# Patient Record
Sex: Female | Born: 1960
Health system: Southern US, Community
[De-identification: ages and names within clinical notes are randomized; demographics above are authoritative.]

## PROBLEM LIST (undated history)

## (undated) DIAGNOSIS — C50919 Malignant neoplasm of unspecified site of unspecified female breast: Secondary | ICD-10-CM

## (undated) DIAGNOSIS — C801 Malignant (primary) neoplasm, unspecified: Secondary | ICD-10-CM

## (undated) DIAGNOSIS — Z853 Personal history of malignant neoplasm of breast: Secondary | ICD-10-CM

## (undated) DIAGNOSIS — R921 Mammographic calcification found on diagnostic imaging of breast: Secondary | ICD-10-CM

## (undated) HISTORY — PX: BREAST LUMPECTOMY: SHX2

## (undated) HISTORY — PX: BREAST RECONSTRUCTION: SHX9

## (undated) HISTORY — DX: Malignant neoplasm of unspecified site of unspecified female breast: C50.919

## (undated) HISTORY — DX: Mammographic calcification found on diagnostic imaging of breast: R92.1

---

## 2008-06-16 HISTORY — PX: MASTECTOMY, RADICAL: SHX710

## 2008-07-10 ENCOUNTER — Ambulatory Visit: Payer: Self-pay | Admitting: Family Medicine

## 2008-07-10 DIAGNOSIS — F329 Major depressive disorder, single episode, unspecified: Secondary | ICD-10-CM

## 2008-07-10 DIAGNOSIS — N63 Unspecified lump in unspecified breast: Secondary | ICD-10-CM

## 2008-07-10 DIAGNOSIS — Z8659 Personal history of other mental and behavioral disorders: Secondary | ICD-10-CM | POA: Insufficient documentation

## 2008-07-11 ENCOUNTER — Telehealth: Payer: Self-pay | Admitting: Family Medicine

## 2008-07-14 ENCOUNTER — Encounter (INDEPENDENT_AMBULATORY_CARE_PROVIDER_SITE_OTHER): Payer: Self-pay | Admitting: Diagnostic Radiology

## 2008-07-14 ENCOUNTER — Encounter: Admission: RE | Admit: 2008-07-14 | Discharge: 2008-07-14 | Payer: Self-pay | Admitting: Family Medicine

## 2008-07-14 ENCOUNTER — Encounter: Payer: Self-pay | Admitting: Family Medicine

## 2008-07-17 LAB — CONVERTED CEMR LAB: Glucose, Bld: 98 mg/dL (ref 70–99)

## 2008-07-18 ENCOUNTER — Encounter: Admission: RE | Admit: 2008-07-18 | Discharge: 2008-07-18 | Payer: Self-pay | Admitting: Family Medicine

## 2008-07-19 ENCOUNTER — Encounter: Admission: RE | Admit: 2008-07-19 | Discharge: 2008-07-19 | Payer: Self-pay | Admitting: Family Medicine

## 2008-08-01 ENCOUNTER — Encounter: Admission: RE | Admit: 2008-08-01 | Discharge: 2008-08-01 | Payer: Self-pay | Admitting: General Surgery

## 2008-08-02 ENCOUNTER — Encounter: Payer: Self-pay | Admitting: Family Medicine

## 2008-08-02 ENCOUNTER — Ambulatory Visit (HOSPITAL_BASED_OUTPATIENT_CLINIC_OR_DEPARTMENT_OTHER): Admission: RE | Admit: 2008-08-02 | Discharge: 2008-08-02 | Payer: Self-pay | Admitting: General Surgery

## 2008-08-02 ENCOUNTER — Encounter (INDEPENDENT_AMBULATORY_CARE_PROVIDER_SITE_OTHER): Payer: Self-pay | Admitting: General Surgery

## 2008-08-23 ENCOUNTER — Ambulatory Visit: Payer: Self-pay | Admitting: Oncology

## 2008-08-24 ENCOUNTER — Ambulatory Visit: Admission: RE | Admit: 2008-08-24 | Discharge: 2008-09-25 | Payer: Self-pay | Admitting: General Surgery

## 2008-08-25 ENCOUNTER — Encounter: Payer: Self-pay | Admitting: Family Medicine

## 2008-08-30 ENCOUNTER — Encounter (INDEPENDENT_AMBULATORY_CARE_PROVIDER_SITE_OTHER): Payer: Self-pay | Admitting: General Surgery

## 2008-08-30 ENCOUNTER — Ambulatory Visit (HOSPITAL_COMMUNITY): Admission: RE | Admit: 2008-08-30 | Discharge: 2008-08-31 | Payer: Self-pay | Admitting: General Surgery

## 2008-09-06 ENCOUNTER — Encounter: Payer: Self-pay | Admitting: Family Medicine

## 2008-09-13 ENCOUNTER — Encounter: Payer: Self-pay | Admitting: Family Medicine

## 2008-09-13 LAB — CBC WITH DIFFERENTIAL/PLATELET
Basophils Absolute: 0.1 10*3/uL (ref 0.0–0.1)
HCT: 39.3 % (ref 34.8–46.6)
HGB: 13.3 g/dL (ref 11.6–15.9)
LYMPH%: 16.8 % (ref 14.0–49.7)
MCH: 32.1 pg (ref 25.1–34.0)
MONO#: 0.4 10*3/uL (ref 0.1–0.9)
NEUT%: 75.5 % (ref 38.4–76.8)
Platelets: 398 10*3/uL (ref 145–400)
WBC: 8.1 10*3/uL (ref 3.9–10.3)
lymph#: 1.4 10*3/uL (ref 0.9–3.3)

## 2008-09-14 LAB — COMPREHENSIVE METABOLIC PANEL
BUN: 10 mg/dL (ref 6–23)
CO2: 25 mEq/L (ref 19–32)
Calcium: 8.8 mg/dL (ref 8.4–10.5)
Chloride: 102 mEq/L (ref 96–112)
Creatinine, Ser: 0.71 mg/dL (ref 0.40–1.20)
Glucose, Bld: 95 mg/dL (ref 70–99)
Total Bilirubin: 0.4 mg/dL (ref 0.3–1.2)

## 2008-09-14 LAB — LACTATE DEHYDROGENASE: LDH: 139 U/L (ref 94–250)

## 2008-09-14 LAB — CANCER ANTIGEN 27.29: CA 27.29: <4 U/mL (ref 0–39)

## 2008-09-15 ENCOUNTER — Ambulatory Visit (HOSPITAL_COMMUNITY): Admission: RE | Admit: 2008-09-15 | Discharge: 2008-09-15 | Payer: Self-pay | Admitting: Oncology

## 2008-09-20 ENCOUNTER — Encounter: Payer: Self-pay | Admitting: Oncology

## 2008-09-20 ENCOUNTER — Ambulatory Visit (HOSPITAL_COMMUNITY): Admission: RE | Admit: 2008-09-20 | Discharge: 2008-09-20 | Payer: Self-pay | Admitting: Oncology

## 2008-09-20 ENCOUNTER — Ambulatory Visit: Payer: Self-pay | Admitting: Internal Medicine

## 2008-09-20 LAB — PROTHROMBIN TIME
INR: 1 (ref 0.0–1.5)
Prothrombin Time: 13.3 seconds (ref 11.6–15.2)

## 2008-09-20 LAB — PROTEIN / CREATININE RATIO, URINE: Protein Creatinine Ratio: 0.1 (ref <0.15)

## 2008-10-12 ENCOUNTER — Ambulatory Visit: Payer: Self-pay | Admitting: Oncology

## 2008-10-25 ENCOUNTER — Encounter: Payer: Self-pay | Admitting: Family Medicine

## 2008-10-25 LAB — CBC WITH DIFFERENTIAL/PLATELET
Basophils Absolute: 0.1 10*3/uL (ref 0.0–0.1)
EOS%: 0 % (ref 0.0–7.0)
Eosinophils Absolute: 0 10*3/uL (ref 0.0–0.5)
HGB: 12.7 g/dL (ref 11.6–15.9)
NEUT#: 5 10*3/uL (ref 1.5–6.5)
RDW: 12.2 % (ref 11.2–14.5)
lymph#: 1.4 10*3/uL (ref 0.9–3.3)

## 2008-10-25 LAB — COMPREHENSIVE METABOLIC PANEL
ALT: 16 U/L (ref 0–35)
CO2: 28 mEq/L (ref 19–32)
Calcium: 9 mg/dL (ref 8.4–10.5)
Chloride: 101 mEq/L (ref 96–112)
Creatinine, Ser: 0.71 mg/dL (ref 0.40–1.20)
Total Protein: 6.8 g/dL (ref 6.0–8.3)

## 2008-10-25 LAB — RESEARCH LABS

## 2008-11-01 ENCOUNTER — Encounter: Payer: Self-pay | Admitting: Family Medicine

## 2008-11-01 LAB — CBC WITH DIFFERENTIAL/PLATELET
BASO%: 0.5 % (ref 0.0–2.0)
MCHC: 33.9 g/dL (ref 31.5–36.0)
MONO#: 0 10*3/uL — ABNORMAL LOW (ref 0.1–0.9)
RBC: 4.26 10*6/uL (ref 3.70–5.45)
RDW: 12.4 % (ref 11.2–14.5)
WBC: 6 10*3/uL (ref 3.9–10.3)
lymph#: 1 10*3/uL (ref 0.9–3.3)

## 2008-11-08 ENCOUNTER — Encounter: Payer: Self-pay | Admitting: Family Medicine

## 2008-11-08 LAB — CBC WITH DIFFERENTIAL/PLATELET
BASO%: 0.7 % (ref 0.0–2.0)
Basophils Absolute: 0.1 10e3/uL (ref 0.0–0.1)
EOS%: 0.1 % (ref 0.0–7.0)
Eosinophils Absolute: 0 10e3/uL (ref 0.0–0.5)
HCT: 39.6 % (ref 34.8–46.6)
HGB: 13.5 g/dL (ref 11.6–15.9)
LYMPH%: 17.9 % (ref 14.0–49.7)
MCH: 31.9 pg (ref 25.1–34.0)
MCHC: 34.2 g/dL (ref 31.5–36.0)
MCV: 93.3 fL (ref 79.5–101.0)
MONO#: 0.5 10e3/uL (ref 0.1–0.9)
MONO%: 5.1 % (ref 0.0–14.0)
NEUT#: 7.8 10e3/uL — ABNORMAL HIGH (ref 1.5–6.5)
NEUT%: 76.2 % (ref 38.4–76.8)
Platelets: 282 10e3/uL (ref 145–400)
RBC: 4.25 10e6/uL (ref 3.70–5.45)
RDW: 12.3 % (ref 11.2–14.5)
WBC: 10.2 10e3/uL (ref 3.9–10.3)
lymph#: 1.8 10e3/uL (ref 0.9–3.3)

## 2008-11-08 LAB — COMPREHENSIVE METABOLIC PANEL
ALT: 22 U/L (ref 0–35)
Albumin: 4 g/dL (ref 3.5–5.2)
BUN: 7 mg/dL (ref 6–23)
CO2: 28 mEq/L (ref 19–32)
Calcium: 9.2 mg/dL (ref 8.4–10.5)
Chloride: 102 mEq/L (ref 96–112)
Creatinine, Ser: 0.68 mg/dL (ref 0.40–1.20)
Potassium: 4.7 mEq/L (ref 3.5–5.3)

## 2008-11-15 ENCOUNTER — Encounter: Payer: Self-pay | Admitting: Family Medicine

## 2008-11-15 LAB — CBC WITH DIFFERENTIAL/PLATELET
Eosinophils Absolute: 0 10*3/uL (ref 0.0–0.5)
HCT: 36.9 % (ref 34.8–46.6)
LYMPH%: 9.5 % — ABNORMAL LOW (ref 14.0–49.7)
MONO#: 0.1 10*3/uL (ref 0.1–0.9)
NEUT#: 5.9 10*3/uL (ref 1.5–6.5)
Platelets: 206 10*3/uL (ref 145–400)
RBC: 4.03 10*6/uL (ref 3.70–5.45)
WBC: 6.7 10*3/uL (ref 3.9–10.3)
nRBC: 0 % (ref 0–0)

## 2008-11-23 ENCOUNTER — Encounter: Payer: Self-pay | Admitting: Family Medicine

## 2008-11-23 LAB — COMPREHENSIVE METABOLIC PANEL
AST: 27 U/L (ref 0–37)
Albumin: 3.9 g/dL (ref 3.5–5.2)
BUN: 6 mg/dL (ref 6–23)
Calcium: 8.8 mg/dL (ref 8.4–10.5)
Chloride: 103 mEq/L (ref 96–112)
Glucose, Bld: 73 mg/dL (ref 70–99)
Potassium: 3.9 mEq/L (ref 3.5–5.3)
Sodium: 139 mEq/L (ref 135–145)
Total Protein: 6.7 g/dL (ref 6.0–8.3)

## 2008-11-23 LAB — CBC WITH DIFFERENTIAL/PLATELET
Basophils Absolute: 0 10*3/uL (ref 0.0–0.1)
Eosinophils Absolute: 0 10*3/uL (ref 0.0–0.5)
HGB: 12.8 g/dL (ref 11.6–15.9)
NEUT#: 6.8 10*3/uL — ABNORMAL HIGH (ref 1.5–6.5)
RBC: 3.93 10*6/uL (ref 3.70–5.45)
RDW: 12.3 % (ref 11.2–14.5)
WBC: 8.5 10*3/uL (ref 3.9–10.3)
lymph#: 1.3 10*3/uL (ref 0.9–3.3)

## 2008-12-05 ENCOUNTER — Ambulatory Visit: Payer: Self-pay | Admitting: Oncology

## 2008-12-07 ENCOUNTER — Encounter: Payer: Self-pay | Admitting: Family Medicine

## 2008-12-07 LAB — COMPREHENSIVE METABOLIC PANEL
Albumin: 4.1 g/dL (ref 3.5–5.2)
BUN: 6 mg/dL (ref 6–23)
Calcium: 9 mg/dL (ref 8.4–10.5)
Chloride: 102 mEq/L (ref 96–112)
Glucose, Bld: 92 mg/dL (ref 70–99)
Potassium: 4.6 mEq/L (ref 3.5–5.3)

## 2008-12-07 LAB — CBC WITH DIFFERENTIAL/PLATELET
Basophils Absolute: 0 10*3/uL (ref 0.0–0.1)
Eosinophils Absolute: 0 10*3/uL (ref 0.0–0.5)
HCT: 35.1 % (ref 34.8–46.6)
MCV: 94.2 fL (ref 79.5–101.0)
Platelets: 291 10*3/uL (ref 145–400)
RDW: 12.7 % (ref 11.2–14.5)
WBC: 9.3 10*3/uL (ref 3.9–10.3)
lymph#: 1.1 10*3/uL (ref 0.9–3.3)

## 2008-12-14 ENCOUNTER — Encounter: Payer: Self-pay | Admitting: Family Medicine

## 2008-12-14 ENCOUNTER — Encounter: Payer: Self-pay | Admitting: Oncology

## 2008-12-14 ENCOUNTER — Encounter: Payer: Self-pay | Admitting: Internal Medicine

## 2008-12-14 ENCOUNTER — Ambulatory Visit: Payer: Self-pay

## 2008-12-14 LAB — CBC WITH DIFFERENTIAL/PLATELET
BASO%: 1.9 % (ref 0.0–2.0)
EOS%: 0 % (ref 0.0–7.0)
LYMPH%: 8.8 % — ABNORMAL LOW (ref 14.0–49.7)
MCH: 30.9 pg (ref 25.1–34.0)
MCHC: 33.6 g/dL (ref 31.5–36.0)
MONO#: 0.1 10*3/uL (ref 0.1–0.9)
RBC: 3.62 10*6/uL — ABNORMAL LOW (ref 3.70–5.45)
WBC: 5.7 10*3/uL (ref 3.9–10.3)
lymph#: 0.5 10*3/uL — ABNORMAL LOW (ref 0.9–3.3)

## 2008-12-20 ENCOUNTER — Encounter: Payer: Self-pay | Admitting: Family Medicine

## 2008-12-20 LAB — COMPREHENSIVE METABOLIC PANEL
ALT: 14 U/L (ref 0–35)
AST: 17 U/L (ref 0–37)
Albumin: 4.3 g/dL (ref 3.5–5.2)
CO2: 26 mEq/L (ref 19–32)
Calcium: 9.1 mg/dL (ref 8.4–10.5)
Chloride: 104 mEq/L (ref 96–112)
Creatinine, Ser: 0.73 mg/dL (ref 0.40–1.20)
Potassium: 4.4 mEq/L (ref 3.5–5.3)
Sodium: 140 mEq/L (ref 135–145)
Total Protein: 6.8 g/dL (ref 6.0–8.3)

## 2008-12-20 LAB — CBC WITH DIFFERENTIAL/PLATELET
BASO%: 2 % (ref 0.0–2.0)
EOS%: 0 % (ref 0.0–7.0)
HCT: 33.5 % — ABNORMAL LOW (ref 34.8–46.6)
HGB: 11.5 g/dL — ABNORMAL LOW (ref 11.6–15.9)
MCHC: 34.4 g/dL (ref 31.5–36.0)
MONO#: 0.7 10*3/uL (ref 0.1–0.9)
NEUT%: 66.9 % (ref 38.4–76.8)
RDW: 14.2 % (ref 11.2–14.5)
WBC: 4.7 10*3/uL (ref 3.9–10.3)
lymph#: 0.8 10*3/uL — ABNORMAL LOW (ref 0.9–3.3)

## 2008-12-20 LAB — APTT: aPTT: 23 seconds — ABNORMAL LOW (ref 24–37)

## 2008-12-28 ENCOUNTER — Encounter: Payer: Self-pay | Admitting: Family Medicine

## 2008-12-28 LAB — CBC WITH DIFFERENTIAL/PLATELET
Basophils Absolute: 0.1 10*3/uL (ref 0.0–0.1)
EOS%: 0 % (ref 0.0–7.0)
HCT: 33.1 % — ABNORMAL LOW (ref 34.8–46.6)
HGB: 10.8 g/dL — ABNORMAL LOW (ref 11.6–15.9)
MCH: 30.8 pg (ref 25.1–34.0)
MONO#: 0.4 10*3/uL (ref 0.1–0.9)
NEUT#: 2.4 10*3/uL (ref 1.5–6.5)
RDW: 16 % — ABNORMAL HIGH (ref 11.2–14.5)
WBC: 3.6 10*3/uL — ABNORMAL LOW (ref 3.9–10.3)
lymph#: 0.8 10*3/uL — ABNORMAL LOW (ref 0.9–3.3)

## 2009-01-04 ENCOUNTER — Ambulatory Visit: Payer: Self-pay | Admitting: Oncology

## 2009-01-04 ENCOUNTER — Encounter: Payer: Self-pay | Admitting: Family Medicine

## 2009-01-04 LAB — CBC WITH DIFFERENTIAL/PLATELET
Basophils Absolute: 0.1 10*3/uL (ref 0.0–0.1)
Eosinophils Absolute: 0 10*3/uL (ref 0.0–0.5)
HGB: 10.9 g/dL — ABNORMAL LOW (ref 11.6–15.9)
LYMPH%: 20.6 % (ref 14.0–49.7)
MONO#: 0.3 10*3/uL (ref 0.1–0.9)
NEUT#: 2.4 10*3/uL (ref 1.5–6.5)
Platelets: 290 10*3/uL (ref 145–400)
RBC: 3.28 10*6/uL — ABNORMAL LOW (ref 3.70–5.45)
RDW: 17.8 % — ABNORMAL HIGH (ref 11.2–14.5)
WBC: 3.6 10*3/uL — ABNORMAL LOW (ref 3.9–10.3)

## 2009-01-11 ENCOUNTER — Encounter: Payer: Self-pay | Admitting: Family Medicine

## 2009-01-11 LAB — CBC WITH DIFFERENTIAL/PLATELET
Eosinophils Absolute: 0 10*3/uL (ref 0.0–0.5)
HCT: 33.6 % — ABNORMAL LOW (ref 34.8–46.6)
LYMPH%: 19.3 % (ref 14.0–49.7)
MONO#: 0.4 10*3/uL (ref 0.1–0.9)
NEUT#: 2.6 10*3/uL (ref 1.5–6.5)
NEUT%: 68.4 % (ref 38.4–76.8)
Platelets: 324 10*3/uL (ref 145–400)
WBC: 3.8 10*3/uL — ABNORMAL LOW (ref 3.9–10.3)

## 2009-01-11 LAB — COMPREHENSIVE METABOLIC PANEL
BUN: 9 mg/dL (ref 6–23)
CO2: 28 mEq/L (ref 19–32)
Creatinine, Ser: 0.58 mg/dL (ref 0.40–1.20)
Glucose, Bld: 99 mg/dL (ref 70–99)
Total Bilirubin: 0.7 mg/dL (ref 0.3–1.2)

## 2009-01-17 ENCOUNTER — Encounter: Payer: Self-pay | Admitting: Family Medicine

## 2009-01-17 LAB — CBC WITH DIFFERENTIAL/PLATELET
Basophils Absolute: 0.1 10*3/uL (ref 0.0–0.1)
Eosinophils Absolute: 0 10*3/uL (ref 0.0–0.5)
HCT: 33.8 % — ABNORMAL LOW (ref 34.8–46.6)
HGB: 11.4 g/dL — ABNORMAL LOW (ref 11.6–15.9)
MCH: 33.8 pg (ref 25.1–34.0)
MCV: 99.7 fL (ref 79.5–101.0)
MONO%: 4.6 % (ref 0.0–14.0)
NEUT#: 4.7 10*3/uL (ref 1.5–6.5)
NEUT%: 78.1 % — ABNORMAL HIGH (ref 38.4–76.8)
RDW: 18.5 % — ABNORMAL HIGH (ref 11.2–14.5)
lymph#: 1 10*3/uL (ref 0.9–3.3)

## 2009-01-25 ENCOUNTER — Encounter: Payer: Self-pay | Admitting: Family Medicine

## 2009-01-25 LAB — CBC WITH DIFFERENTIAL/PLATELET
Eosinophils Absolute: 0 10*3/uL (ref 0.0–0.5)
HCT: 34.6 % — ABNORMAL LOW (ref 34.8–46.6)
LYMPH%: 23.2 % (ref 14.0–49.7)
MONO#: 0.4 10*3/uL (ref 0.1–0.9)
NEUT#: 2.1 10*3/uL (ref 1.5–6.5)
Platelets: 289 10*3/uL (ref 145–400)
RBC: 3.43 10*6/uL — ABNORMAL LOW (ref 3.70–5.45)
WBC: 3.4 10*3/uL — ABNORMAL LOW (ref 3.9–10.3)
lymph#: 0.8 10*3/uL — ABNORMAL LOW (ref 0.9–3.3)

## 2009-01-31 ENCOUNTER — Encounter: Payer: Self-pay | Admitting: Family Medicine

## 2009-01-31 LAB — CBC WITH DIFFERENTIAL/PLATELET
BASO%: 1.6 % (ref 0.0–2.0)
Basophils Absolute: 0.1 10*3/uL (ref 0.0–0.1)
Eosinophils Absolute: 0 10*3/uL (ref 0.0–0.5)
HCT: 36.6 % (ref 34.8–46.6)
HGB: 12.3 g/dL (ref 11.6–15.9)
LYMPH%: 17.6 % (ref 14.0–49.7)
MCHC: 33.7 g/dL (ref 31.5–36.0)
MONO#: 0.2 10*3/uL (ref 0.1–0.9)
NEUT%: 75.5 % (ref 38.4–76.8)
Platelets: 320 10*3/uL (ref 145–400)
WBC: 3.7 10*3/uL — ABNORMAL LOW (ref 3.9–10.3)
lymph#: 0.7 10*3/uL — ABNORMAL LOW (ref 0.9–3.3)

## 2009-01-31 LAB — PROTEIN / CREATININE RATIO, URINE
Creatinine, Urine: 94.9 mg/dL
Protein Creatinine Ratio: 0.07 (ref <0.15)
Total Protein, Urine: 7 mg/dL

## 2009-01-31 LAB — COMPREHENSIVE METABOLIC PANEL
BUN: 8 mg/dL (ref 6–23)
CO2: 29 mEq/L (ref 19–32)
Calcium: 9.2 mg/dL (ref 8.4–10.5)
Chloride: 105 mEq/L (ref 96–112)
Creatinine, Ser: 0.62 mg/dL (ref 0.40–1.20)
Glucose, Bld: 141 mg/dL — ABNORMAL HIGH (ref 70–99)
Total Bilirubin: 0.6 mg/dL (ref 0.3–1.2)

## 2009-02-06 ENCOUNTER — Ambulatory Visit: Payer: Self-pay | Admitting: Oncology

## 2009-02-08 ENCOUNTER — Encounter: Payer: Self-pay | Admitting: Family Medicine

## 2009-02-08 LAB — CBC WITH DIFFERENTIAL/PLATELET
BASO%: 1.7 % (ref 0.0–2.0)
Eosinophils Absolute: 0 10*3/uL (ref 0.0–0.5)
LYMPH%: 17.8 % (ref 14.0–49.7)
MCHC: 34.4 g/dL (ref 31.5–36.0)
MONO#: 0.3 10*3/uL (ref 0.1–0.9)
NEUT#: 2.8 10*3/uL (ref 1.5–6.5)
Platelets: 309 10*3/uL (ref 145–400)
RBC: 3.35 10*6/uL — ABNORMAL LOW (ref 3.70–5.45)
RDW: 15.4 % — ABNORMAL HIGH (ref 11.2–14.5)
WBC: 4 10*3/uL (ref 3.9–10.3)

## 2009-02-15 ENCOUNTER — Encounter: Payer: Self-pay | Admitting: Family Medicine

## 2009-02-15 LAB — CBC WITH DIFFERENTIAL/PLATELET
BASO%: 1.7 % (ref 0.0–2.0)
EOS%: 0.4 % (ref 0.0–7.0)
MCH: 34.5 pg — ABNORMAL HIGH (ref 25.1–34.0)
MCHC: 33.9 g/dL (ref 31.5–36.0)
MCV: 101.8 fL — ABNORMAL HIGH (ref 79.5–101.0)
MONO%: 12.6 % (ref 0.0–14.0)
NEUT#: 2.6 10*3/uL (ref 1.5–6.5)
RBC: 3.51 10*6/uL — ABNORMAL LOW (ref 3.70–5.45)
RDW: 14.8 % — ABNORMAL HIGH (ref 11.2–14.5)

## 2009-02-22 ENCOUNTER — Encounter: Payer: Self-pay | Admitting: Family Medicine

## 2009-02-22 LAB — CBC WITH DIFFERENTIAL/PLATELET
BASO%: 2.1 % — ABNORMAL HIGH (ref 0.0–2.0)
Eosinophils Absolute: 0 10*3/uL (ref 0.0–0.5)
MCHC: 34.7 g/dL (ref 31.5–36.0)
MONO#: 0.5 10*3/uL (ref 0.1–0.9)
NEUT#: 3.4 10*3/uL (ref 1.5–6.5)
RBC: 3.41 10*6/uL — ABNORMAL LOW (ref 3.70–5.45)
RDW: 14.2 % (ref 11.2–14.5)
WBC: 5 10*3/uL (ref 3.9–10.3)

## 2009-02-22 LAB — COMPREHENSIVE METABOLIC PANEL
ALT: 23 U/L (ref 0–35)
Albumin: 4 g/dL (ref 3.5–5.2)
Alkaline Phosphatase: 44 U/L (ref 39–117)
CO2: 29 mEq/L (ref 19–32)
Glucose, Bld: 99 mg/dL (ref 70–99)
Potassium: 3.9 mEq/L (ref 3.5–5.3)
Sodium: 135 mEq/L (ref 135–145)
Total Protein: 7.1 g/dL (ref 6.0–8.3)

## 2009-03-01 ENCOUNTER — Encounter: Payer: Self-pay | Admitting: Family Medicine

## 2009-03-01 LAB — CBC WITH DIFFERENTIAL/PLATELET
BASO%: 2.3 % — ABNORMAL HIGH (ref 0.0–2.0)
EOS%: 0.7 % (ref 0.0–7.0)
Eosinophils Absolute: 0 10*3/uL (ref 0.0–0.5)
LYMPH%: 22.1 % (ref 14.0–49.7)
MCHC: 33.6 g/dL (ref 31.5–36.0)
MCV: 101.6 fL — ABNORMAL HIGH (ref 79.5–101.0)
MONO%: 11.8 % (ref 0.0–14.0)
NEUT#: 2.7 10*3/uL (ref 1.5–6.5)
Platelets: 293 10*3/uL (ref 145–400)
RBC: 3.72 10*6/uL (ref 3.70–5.45)
RDW: 13.2 % (ref 11.2–14.5)
WBC: 4.3 10*3/uL (ref 3.9–10.3)

## 2009-03-08 ENCOUNTER — Encounter: Payer: Self-pay | Admitting: Family Medicine

## 2009-03-08 ENCOUNTER — Ambulatory Visit: Payer: Self-pay | Admitting: Oncology

## 2009-03-08 LAB — CBC WITH DIFFERENTIAL/PLATELET
BASO%: 0.4 % (ref 0.0–2.0)
Eosinophils Absolute: 0 10*3/uL (ref 0.0–0.5)
LYMPH%: 9.2 % — ABNORMAL LOW (ref 14.0–49.7)
MCHC: 34.3 g/dL (ref 31.5–36.0)
MONO#: 0.5 10*3/uL (ref 0.1–0.9)
NEUT#: 5.3 10*3/uL (ref 1.5–6.5)
Platelets: 284 10*3/uL (ref 145–400)
RBC: 3.64 10*6/uL — ABNORMAL LOW (ref 3.70–5.45)
RDW: 13.5 % (ref 11.2–14.5)
WBC: 6.4 10*3/uL (ref 3.9–10.3)
lymph#: 0.6 10*3/uL — ABNORMAL LOW (ref 0.9–3.3)

## 2009-03-13 ENCOUNTER — Ambulatory Visit: Payer: Self-pay | Admitting: Family Medicine

## 2009-03-13 ENCOUNTER — Encounter: Payer: Self-pay | Admitting: Family Medicine

## 2009-03-13 ENCOUNTER — Other Ambulatory Visit: Admission: RE | Admit: 2009-03-13 | Discharge: 2009-03-13 | Payer: Self-pay | Admitting: Family Medicine

## 2009-03-14 ENCOUNTER — Encounter: Payer: Self-pay | Admitting: Oncology

## 2009-03-14 ENCOUNTER — Ambulatory Visit: Payer: Self-pay

## 2009-03-14 ENCOUNTER — Encounter: Payer: Self-pay | Admitting: Cardiology

## 2009-03-15 ENCOUNTER — Encounter: Payer: Self-pay | Admitting: Family Medicine

## 2009-03-15 LAB — CBC WITH DIFFERENTIAL/PLATELET
Basophils Absolute: 0.1 10*3/uL (ref 0.0–0.1)
EOS%: 0.4 % (ref 0.0–7.0)
HCT: 36.7 % (ref 34.8–46.6)
HGB: 12.6 g/dL (ref 11.6–15.9)
MCH: 34.3 pg — ABNORMAL HIGH (ref 25.1–34.0)
MONO#: 0.5 10*3/uL (ref 0.1–0.9)
NEUT#: 3 10*3/uL (ref 1.5–6.5)
NEUT%: 67.2 % (ref 38.4–76.8)
RDW: 13.2 % (ref 11.2–14.5)
WBC: 4.5 10*3/uL (ref 3.9–10.3)
lymph#: 0.9 10*3/uL (ref 0.9–3.3)

## 2009-03-15 LAB — UA PROTEIN, DIPSTICK - CHCC: Protein, Urine: NEGATIVE mg/dL

## 2009-03-19 LAB — CONVERTED CEMR LAB: Pap Smear: NORMAL

## 2009-04-28 IMAGING — CT CT CHEST W/ CM
2 of 3 series · 15 of 36 positions shown, 18 images · IV contrast (agent unspecified)
Comparison: None

CLINICAL DATA: New diagnosis of breast cancer.

CT CHEST WITH CONTRAST
TECHNIQUE: Multidetector CT imaging of the chest was performed
following the standard protocol during bolus administration of
intravenous contrast.
Contrast: 80 ml Emnipaque-FGG

[Series 2: chest with st · axial · 0.66mm/px · z∈[-307,-37]mm · 12 of 64 slices shown, 15 images]
[im 5/64  mediastinal]
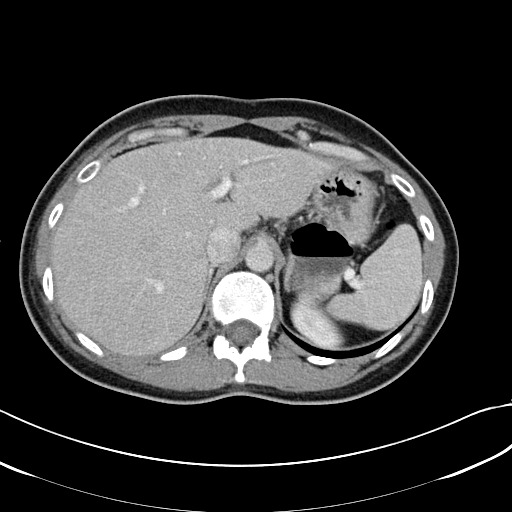
[im 5/64  lung]
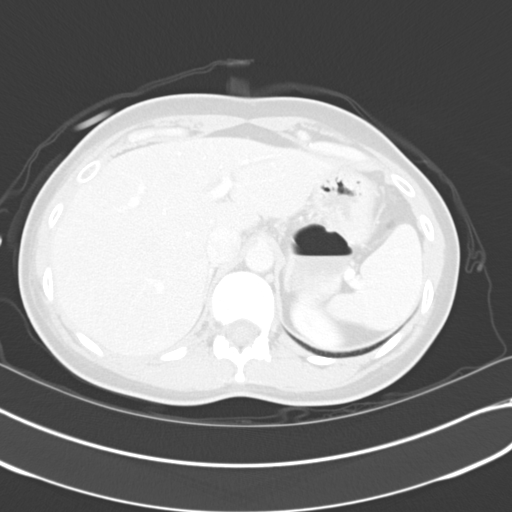
[im 10/64  lung]
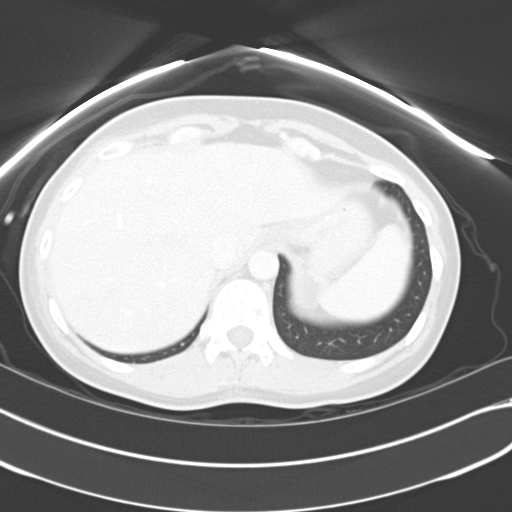
[im 15/64  lung]
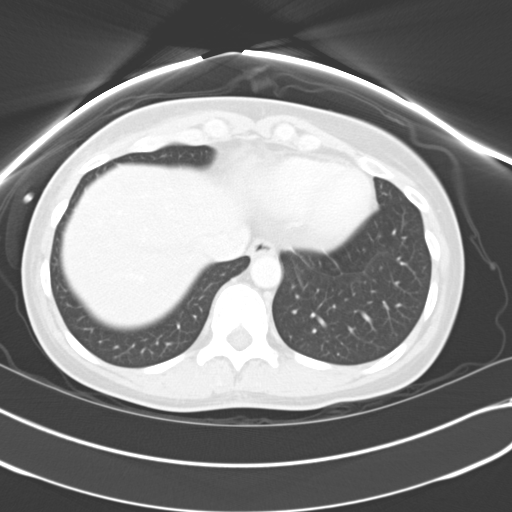
[im 19/64  lung]
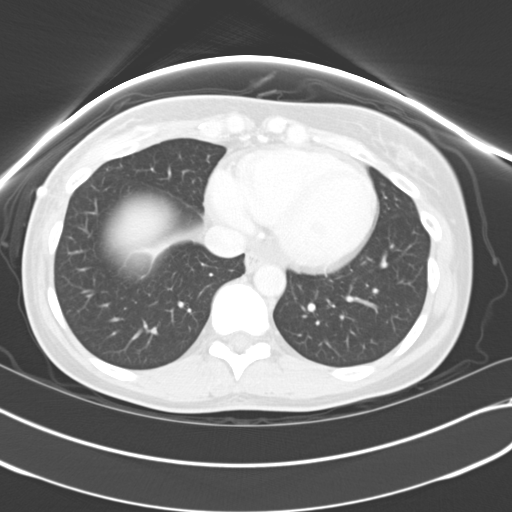
[im 24/64  mediastinal]
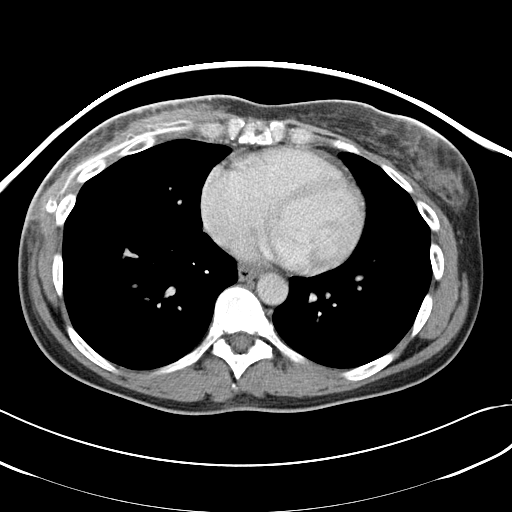
[im 24/64  lung]
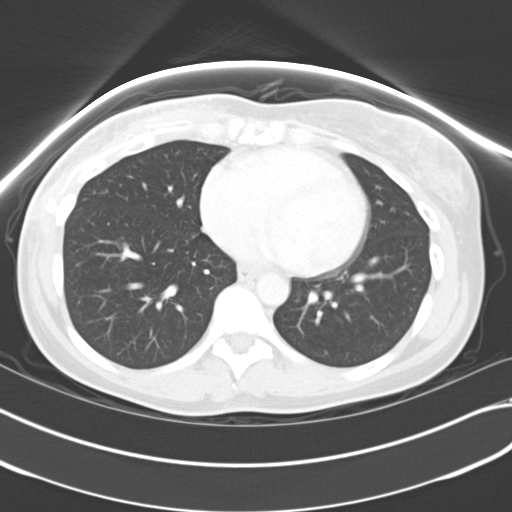
[im 29/64  lung]
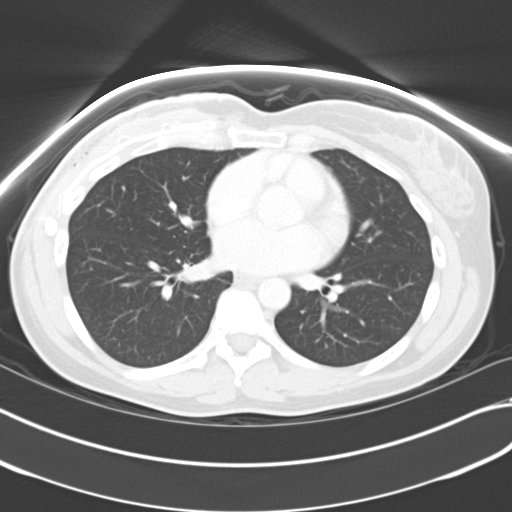
[im 36/64  lung]
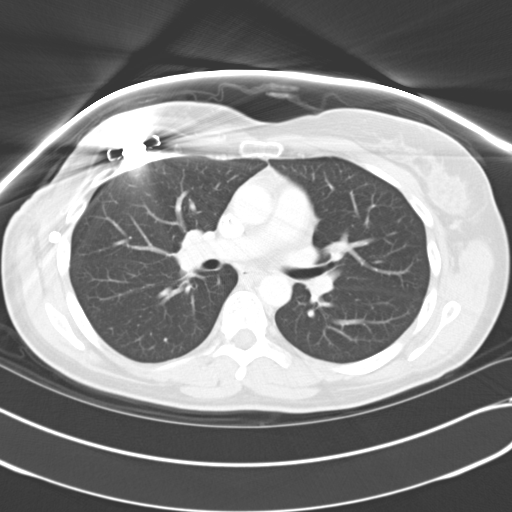
[im 40/64  lung]
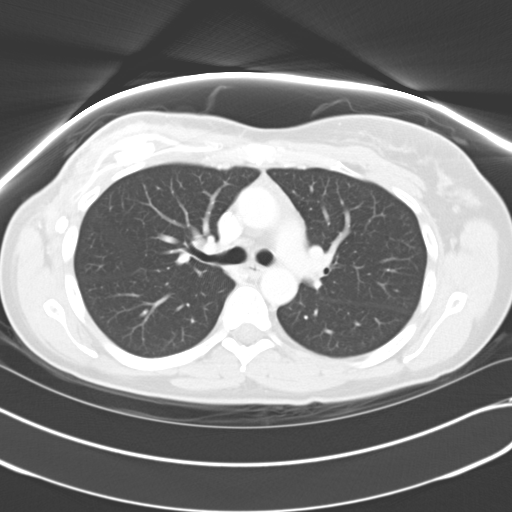
[im 45/64  mediastinal]
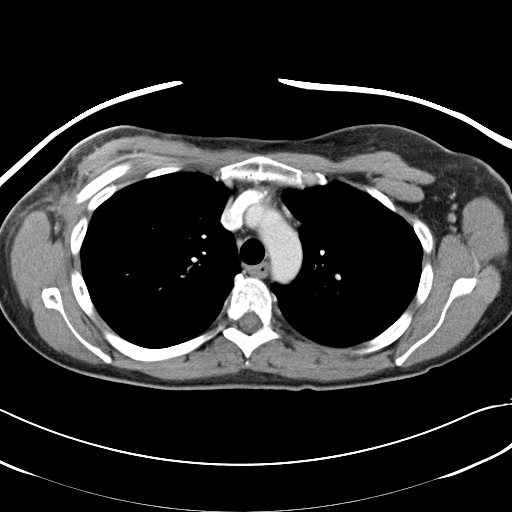
[im 45/64  lung]
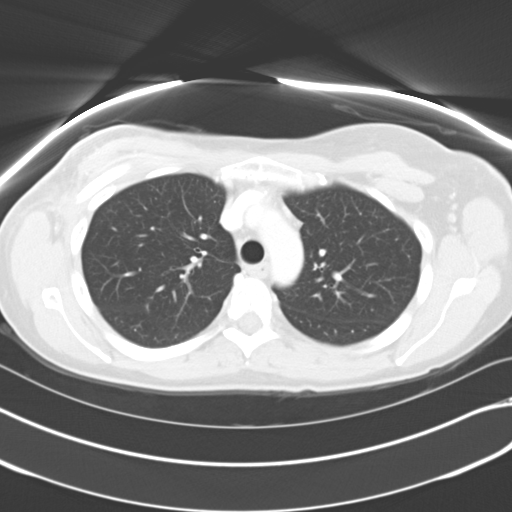
[im 50/64  lung]
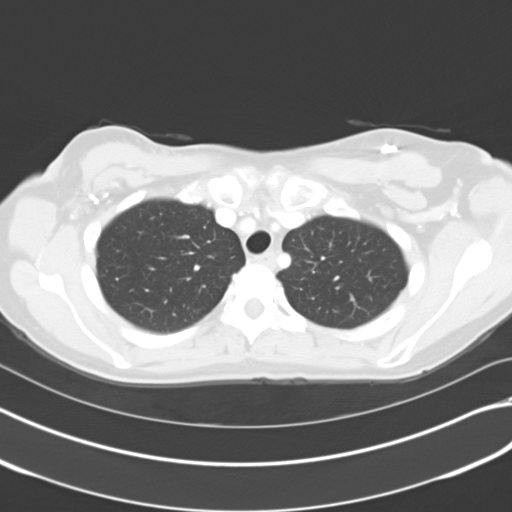
[im 54/64  lung]
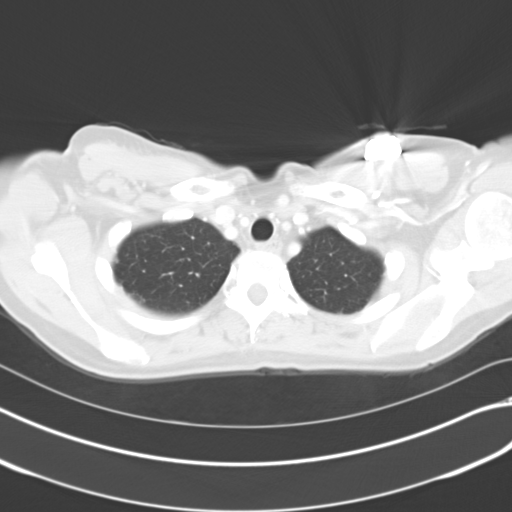
[im 59/64  lung]
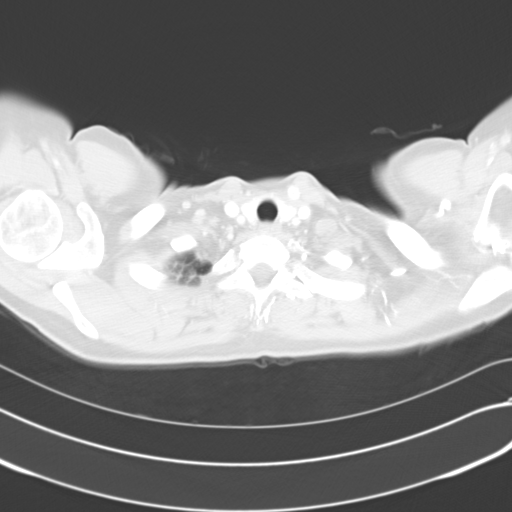

[Series 602: <mpr thick range> · coronal · 0.66mm/px · 3 of 75 slices shown]
[im 15/75  lung]
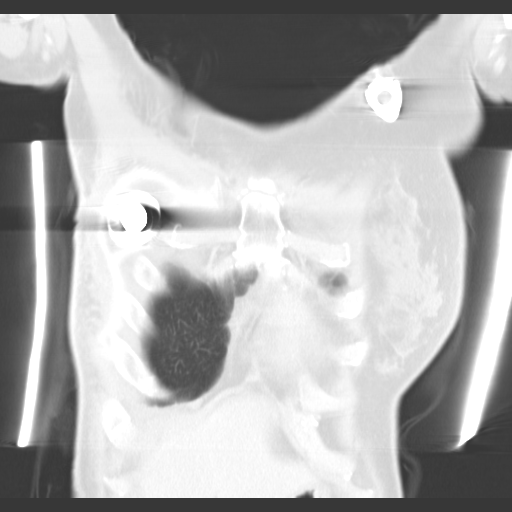
[im 30/75  lung]
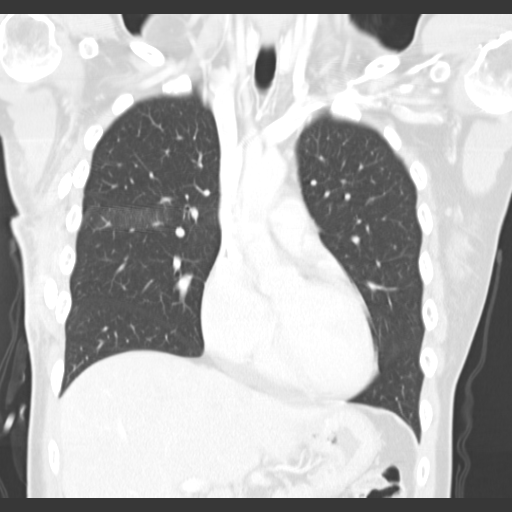
[im 45/75  lung]
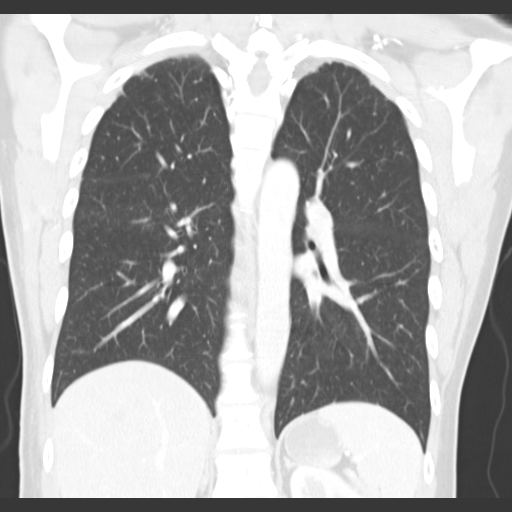

[15 of 36 positions shown; findings below may reference images not displayed]

FINDINGS: No pathologically enlarged mediastinal, hilar or axillary
lymph nodes.  Postoperative changes are seen in the right breast,
with a surgical drain in place.  There are small pre pericardiac
and right internal mammary lymph nodes, measuring up to 4 mm in
short axis (image 26).  Heart size normal.  No pericardial
effusion.

Biapical pulmonary parenchymal scarring.  Lungs otherwise clear.
No pleural fluid.  Airway is unremarkable.

Incidental imaging of the upper abdomen shows no acute findings.
No worrisome lytic or sclerotic lesions.
IMPRESSION: Postop changes in the right breast.  Small right internal mammary
lymph nodes may be reactive.  Attention on follow-up exams is
warranted.

## 2009-05-22 ENCOUNTER — Ambulatory Visit: Payer: Self-pay | Admitting: Oncology

## 2009-05-24 LAB — CBC WITH DIFFERENTIAL/PLATELET
Basophils Absolute: 0.1 10*3/uL (ref 0.0–0.1)
EOS%: 1.2 % (ref 0.0–7.0)
Eosinophils Absolute: 0.1 10*3/uL (ref 0.0–0.5)
LYMPH%: 26.9 % (ref 14.0–49.7)
MCH: 32.1 pg (ref 25.1–34.0)
MCV: 97.1 fL (ref 79.5–101.0)
MONO%: 8.7 % (ref 0.0–14.0)
NEUT#: 3.5 10*3/uL (ref 1.5–6.5)
Platelets: 282 10*3/uL (ref 145–400)
RBC: 4.08 10*6/uL (ref 3.70–5.45)
RDW: 13.5 % (ref 11.2–14.5)

## 2009-05-25 LAB — COMPREHENSIVE METABOLIC PANEL
AST: 17 U/L (ref 0–37)
Albumin: 4.5 g/dL (ref 3.5–5.2)
Alkaline Phosphatase: 55 U/L (ref 39–117)
BUN: 9 mg/dL (ref 6–23)
Calcium: 9.4 mg/dL (ref 8.4–10.5)
Glucose, Bld: 103 mg/dL — ABNORMAL HIGH (ref 70–99)
Potassium: 4.1 mEq/L (ref 3.5–5.3)
Sodium: 141 mEq/L (ref 135–145)
Total Bilirubin: 0.4 mg/dL (ref 0.3–1.2)
Total Protein: 7 g/dL (ref 6.0–8.3)

## 2009-05-25 LAB — CANCER ANTIGEN 27.29: CA 27.29: 7 U/mL (ref 0–39)

## 2009-06-18 ENCOUNTER — Encounter: Payer: Self-pay | Admitting: Family Medicine

## 2009-07-19 ENCOUNTER — Encounter: Admission: RE | Admit: 2009-07-19 | Discharge: 2009-07-19 | Payer: Self-pay | Admitting: Oncology

## 2009-09-07 ENCOUNTER — Ambulatory Visit: Payer: Self-pay | Admitting: Oncology

## 2009-09-11 ENCOUNTER — Encounter: Payer: Self-pay | Admitting: Family Medicine

## 2009-09-11 LAB — COMPREHENSIVE METABOLIC PANEL
ALT: 14 U/L (ref 0–35)
AST: 17 U/L (ref 0–37)
Chloride: 101 mEq/L (ref 96–112)
Creatinine, Ser: 0.7 mg/dL (ref 0.40–1.20)
Sodium: 139 mEq/L (ref 135–145)
Total Bilirubin: 0.5 mg/dL (ref 0.3–1.2)

## 2009-09-11 LAB — CBC WITH DIFFERENTIAL/PLATELET
BASO%: 2.2 % — ABNORMAL HIGH (ref 0.0–2.0)
LYMPH%: 30.7 % (ref 14.0–49.7)
MCHC: 34.1 g/dL (ref 31.5–36.0)
MONO#: 0.3 10*3/uL (ref 0.1–0.9)
MONO%: 10 % (ref 0.0–14.0)
Platelets: 273 10*3/uL (ref 145–400)
RBC: 4.07 10*6/uL (ref 3.70–5.45)
RDW: 12.9 % (ref 11.2–14.5)
WBC: 3.4 10*3/uL — ABNORMAL LOW (ref 3.9–10.3)

## 2009-09-11 LAB — FOLLICLE STIMULATING HORMONE: FSH: 33.4 m[IU]/mL

## 2009-09-11 LAB — RESEARCH LABS

## 2009-09-21 LAB — ESTRADIOL, ULTRA SENS: Estradiol, Ultra Sensitive: 7 pg/mL

## 2009-11-07 ENCOUNTER — Encounter: Payer: Self-pay | Admitting: Internal Medicine

## 2009-11-08 ENCOUNTER — Ambulatory Visit (HOSPITAL_COMMUNITY): Admission: RE | Admit: 2009-11-08 | Discharge: 2009-11-08 | Payer: Self-pay | Admitting: Oncology

## 2009-11-08 ENCOUNTER — Encounter: Payer: Self-pay | Admitting: Oncology

## 2009-11-08 ENCOUNTER — Ambulatory Visit: Payer: Self-pay

## 2009-11-08 ENCOUNTER — Ambulatory Visit: Payer: Self-pay | Admitting: Internal Medicine

## 2009-12-11 ENCOUNTER — Ambulatory Visit: Payer: Self-pay | Admitting: Oncology

## 2009-12-13 ENCOUNTER — Encounter: Payer: Self-pay | Admitting: Family Medicine

## 2009-12-13 LAB — CBC WITH DIFFERENTIAL/PLATELET
Basophils Absolute: 0.1 10*3/uL (ref 0.0–0.1)
EOS%: 0.6 % (ref 0.0–7.0)
Eosinophils Absolute: 0 10*3/uL (ref 0.0–0.5)
HCT: 39.7 % (ref 34.8–46.6)
HGB: 13.7 g/dL (ref 11.6–15.9)
LYMPH%: 28 % (ref 14.0–49.7)
MCH: 32.7 pg (ref 25.1–34.0)
MCV: 95 fL (ref 79.5–101.0)
MONO%: 8 % (ref 0.0–14.0)
NEUT#: 2.7 10*3/uL (ref 1.5–6.5)
NEUT%: 61.8 % (ref 38.4–76.8)
Platelets: 266 10*3/uL (ref 145–400)

## 2009-12-13 LAB — COMPREHENSIVE METABOLIC PANEL
AST: 20 U/L (ref 0–37)
Albumin: 4.4 g/dL (ref 3.5–5.2)
Alkaline Phosphatase: 72 U/L (ref 39–117)
BUN: 11 mg/dL (ref 6–23)
Creatinine, Ser: 0.72 mg/dL (ref 0.40–1.20)
Glucose, Bld: 81 mg/dL (ref 70–99)
Potassium: 4.3 mEq/L (ref 3.5–5.3)
Total Bilirubin: 0.6 mg/dL (ref 0.3–1.2)

## 2009-12-13 LAB — FOLLICLE STIMULATING HORMONE: FSH: 44.9 m[IU]/mL

## 2009-12-24 LAB — ESTRADIOL, ULTRA SENS: Estradiol, Ultra Sensitive: <2 pg/mL

## 2010-02-28 ENCOUNTER — Ambulatory Visit: Payer: Self-pay | Admitting: Oncology

## 2010-03-04 LAB — CBC WITH DIFFERENTIAL/PLATELET
BASO%: 0.3 % (ref 0.0–2.0)
EOS%: 0.2 % (ref 0.0–7.0)
HCT: 40 % (ref 34.8–46.6)
LYMPH%: 17.7 % (ref 14.0–49.7)
MCH: 32.1 pg (ref 25.1–34.0)
MCHC: 33.3 g/dL (ref 31.5–36.0)
MONO#: 0.5 10*3/uL (ref 0.1–0.9)
NEUT%: 75.4 % (ref 38.4–76.8)
RBC: 4.15 10*6/uL (ref 3.70–5.45)
WBC: 7.4 10*3/uL (ref 3.9–10.3)
lymph#: 1.3 10*3/uL (ref 0.9–3.3)

## 2010-03-05 LAB — COMPREHENSIVE METABOLIC PANEL
ALT: 18 U/L (ref 0–35)
AST: 21 U/L (ref 0–37)
CO2: 28 mEq/L (ref 19–32)
Creatinine, Ser: 0.76 mg/dL (ref 0.40–1.20)
Sodium: 140 mEq/L (ref 135–145)
Total Bilirubin: 0.4 mg/dL (ref 0.3–1.2)
Total Protein: 7.1 g/dL (ref 6.0–8.3)

## 2010-03-12 ENCOUNTER — Encounter: Payer: Self-pay | Admitting: Family Medicine

## 2010-04-17 ENCOUNTER — Ambulatory Visit: Payer: Self-pay | Admitting: Family Medicine

## 2010-04-17 ENCOUNTER — Other Ambulatory Visit: Admission: RE | Admit: 2010-04-17 | Discharge: 2010-04-17 | Payer: Self-pay | Admitting: Family Medicine

## 2010-04-18 ENCOUNTER — Encounter: Payer: Self-pay | Admitting: Family Medicine

## 2010-04-18 LAB — CONVERTED CEMR LAB
ALT: 17 units/L (ref 0–35)
AST: 27 units/L (ref 0–37)
Albumin: 5.2 g/dL (ref 3.5–5.2)
Alkaline Phosphatase: 82 units/L (ref 39–117)
LDL Cholesterol: 118 mg/dL — ABNORMAL HIGH (ref 0–99)
Potassium: 4.6 meq/L (ref 3.5–5.3)
Sodium: 139 meq/L (ref 135–145)
Total Bilirubin: 0.8 mg/dL (ref 0.3–1.2)
Total Protein: 7.8 g/dL (ref 6.0–8.3)
VLDL: 10 mg/dL (ref 0–40)

## 2010-04-24 ENCOUNTER — Encounter: Payer: Self-pay | Admitting: Family Medicine

## 2010-04-26 LAB — CONVERTED CEMR LAB: Pap Smear: NEGATIVE

## 2010-06-06 ENCOUNTER — Ambulatory Visit: Payer: Self-pay | Admitting: Oncology

## 2010-06-18 ENCOUNTER — Encounter: Payer: Self-pay | Admitting: Family Medicine

## 2010-06-18 LAB — COMPREHENSIVE METABOLIC PANEL
AST: 19 U/L (ref 0–37)
Albumin: 4.9 g/dL (ref 3.5–5.2)
BUN: 12 mg/dL (ref 6–23)
CO2: 29 mEq/L (ref 19–32)
Calcium: 10 mg/dL (ref 8.4–10.5)
Chloride: 102 mEq/L (ref 96–112)
Creatinine, Ser: 0.72 mg/dL (ref 0.40–1.20)
Glucose, Bld: 71 mg/dL (ref 70–99)

## 2010-06-18 LAB — CBC WITH DIFFERENTIAL/PLATELET
Basophils Absolute: 0 10*3/uL (ref 0.0–0.1)
EOS%: 0.8 % (ref 0.0–7.0)
Eosinophils Absolute: 0 10*3/uL (ref 0.0–0.5)
HCT: 42.4 % (ref 34.8–46.6)
HGB: 14.6 g/dL (ref 11.6–15.9)
MCH: 32.9 pg (ref 25.1–34.0)
NEUT#: 3.8 10*3/uL (ref 1.5–6.5)
NEUT%: 68.9 % (ref 38.4–76.8)
lymph#: 1.3 10*3/uL (ref 0.9–3.3)

## 2010-06-18 LAB — FOLLICLE STIMULATING HORMONE: FSH: 34.7 m[IU]/mL

## 2010-06-23 LAB — ESTRADIOL, ULTRA SENS: Estradiol, Ultra Sensitive: <2 pg/mL

## 2010-07-06 ENCOUNTER — Other Ambulatory Visit: Payer: Self-pay | Admitting: Family Medicine

## 2010-07-06 DIAGNOSIS — Z9289 Personal history of other medical treatment: Secondary | ICD-10-CM

## 2010-07-16 NOTE — Letter (Signed)
[facility]   Imported By: Lanelle Bal 01/03/2010 09:54:40  _____________________________________________________________________  External Attachment:    Type:   Image     Comment:   External Document

## 2010-07-16 NOTE — Letter (Signed)
[facility]   Imported By: Lanelle Bal 07/19/2009 10:10:43  _____________________________________________________________________  External Attachment:    Type:   Image     Comment:   External Document

## 2010-07-16 NOTE — Letter (Signed)
Summary: [facility]  [facility]   Imported By: Sherian Rein 04/16/2010 14:49:28  _____________________________________________________________________  External Attachment:    Type:   Image     Comment:   External Document

## 2010-07-16 NOTE — Miscellaneous (Signed)
  Clinical Lists Changes  Observations: Added new observation of TD BOOSTER: Historical (04/16/2001 10:04)      Immunization History:  Tetanus/Td Immunization History:    Tetanus/Td:  historical (04/16/2001)

## 2010-07-16 NOTE — Assessment & Plan Note (Signed)
Summary: CPE W/ PAP   Vital Signs:  Patient profile:   50 year old female Height:      67.5 inches Weight:      144 pounds BMI:     22.30 Pulse rate:   73 / minute BP sitting:   119 / 78  (right arm) Cuff size:   regular  Vitals Entered By: Avon Gully CMA, Duncan Dull) (April 17, 2010 11:04 AM) CC: CPE and PAP   CC:  CPE and PAP.  Current Medications (verified): 1)  Fluoxetine Hcl 10 Mg Caps (Fluoxetine Hcl) .... Take Three By Mouth Daily 2)  Abilify 5 Mg Tabs (Aripiprazole) .... Take 1 Tablet By Mouth Once A Day  Allergies (verified): No Known Drug Allergies  Comments:  Nurse/Medical Assistant: The patient's medications and allergies were reviewed with the patient and were updated in the Medication and Allergy Lists. Avon Gully CMA, Duncan Dull) (April 17, 2010 11:04 AM)  Past History:  Past Medical History: 2 miscarriages.  Right Br invasive ductal carcinoma, multifocal TIc, N0i, Grade 3, triple neg w/ MIB-1 of 74% - Sees Gus Magrinot, MD.   Past Surgical History: Lumpectomy then mastectomy on the right breast 08/2008  Reconstructtive surgery of the right breast 04/2009  Family History: Mother with throat Cancer, non-smoker Father with depression, HTN, high cholesterol, Alzheimers  Social History: Reviewed history from 03/13/2009 and no changes required. 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  Bachelors degree. Married to [employer] with 2 kids.  Never Smoked Alcohol use-yes Drug use-no Regular exercise-yes, walking 5 miles a day  Review of Systems  The patient denies anorexia, fever, weight loss, weight gain, vision loss, decreased hearing, hoarseness, chest pain, syncope, dyspnea on exertion, peripheral edema, prolonged cough, headaches, hemoptysis, abdominal pain, melena, hematochezia, severe indigestion/heartburn, hematuria, incontinence, genital sores, muscle weakness, suspicious skin lesions, transient blindness, difficulty  walking, depression, unusual weight change, abnormal bleeding, enlarged lymph nodes, and breast masses.    Physical Exam  General:  Well-developed,well-nourished,in no acute distress; alert,appropriate and cooperative throughout examination Head:  Normocephalic and atraumatic without obvious abnormalities. No apparent alopecia or balding. Eyes:  No corneal or conjunctival inflammation noted. EOMI. Perrla. Ears:  External ear exam shows no significant lesions or deformities.  Otoscopic examination reveals clear canals, tympanic membranes are intact bilaterally without bulging, retraction, inflammation or discharge. Hearing is grossly normal bilaterally. Nose:  External nasal examination shows no deformity or inflammation. Nasal mucosa are pink and moist without lesions or exudates. Mouth:  Oral mucosa and oropharynx without lesions or exudates.  Teeth in good repair. Neck:  No deformities, masses, or tenderness noted. Chest Wall:  No deformities, masses, or tenderness noted. Breasts:  Right breast with well healed scar and a implant. The arealo is tatooed on.  Left breast is with no lesion.  Lungs:  Normal respiratory effort, chest expands symmetrically. Lungs are clear to auscultation, no crackles or wheezes. Heart:  Normal rate and regular rhythm. S1 and S2 normal without gallop, murmur, click, rub or other extra sounds. Msk:  No deformity or scoliosis noted of thoracic or lumbar spine.   Pulses:  R and L carotid,radial,dorsalis pedis and posterior tibial pulses are full and equal bilaterally Extremities:  No clubbing, cyanosis, edema, or deformity noted with normal full range of motion of all joints.   Neurologic:  No cranial nerve deficits noted. Station and gait are normal.   Sensory, motor and coordinative functions appear intact. Skin:  no rashes.   Cervical Nodes:  No lymphadenopathy noted Axillary Nodes:  No palpable lymphadenopathy Psych:  Cognition and judgment appear intact. Alert  and cooperative with normal attention span and concentration. No apparent delusions, illusions, hallucinations   Impression & Recommendations:  Problem # 1:  ROUTINE GYNECOLOGICAL EXAMINATION (ICD-V72.31) Exam is normal today If pap is normal then repeat in 2 years.   Due for screening labs.  She is following with Dr. Arlice Colt for her BrCA She says her tdap is up to date an dwill callme with that date.  Orders: T-Lipid Profile ([id number]) T-Comprehensive Metabolic Panel [phone])  Problem # 2:  DEPRESSION (ICD-311) WEll controlled on current regimen.  Her updated medication list for this problem includes:    Fluoxetine Hcl 10 Mg Caps (Fluoxetine hcl) .Marland Kitchen... Take three by mouth daily  Complete Medication List: 1)  Fluoxetine Hcl 10 Mg Caps (Fluoxetine hcl) .... Take three by mouth daily 2)  Abilify 5 Mg Tabs (Aripiprazole) .... Take 1 tablet by mouth once a day  Other Orders: Flu Vaccine 48yrs + (29562) Admin 1st Vaccine (13086)  Patient Instructions: 1)  Call me with the date of your last tetanus vaccine.   2)  We will call you with your lab results. ' 3)  You did recieve you flu vaccine today. Prescriptions: ABILIFY 5 MG TABS (ARIPIPRAZOLE) Take 1 tablet by mouth once a day  #90 x 3   Entered and Authorized by:   Nani Gasser MD   Signed by:   Nani Gasser MD on 04/17/2010   Method used:   Electronically to        [facility]* (retail)             ,          Ph: [phone]       Fax: [phone]   RxID:   [id number] FLUOXETINE HCL 10 MG CAPS (FLUOXETINE HCL) take three by mouth daily  #90 x 3   Entered and Authorized by:   Nani Gasser MD   Signed by:   Nani Gasser MD on 04/17/2010   Method used:   Electronically to        [facility]* (retail)             ,          Ph: [phone]       Fax: [phone]   RxID:   [id number]    Orders Added: 1)  T-Lipid Profile [[id number]] 2)  T-Comprehensive Metabolic Panel  [[id number]] 3)  Flu Vaccine 68yrs + [15176] 4)  Admin 1st Vaccine [90471] 5)  Est. Patient age 76-64 [31]   Immunizations Administered:  Influenza Vaccine # 1:    Vaccine Type: Fluvax 3+    Site: left deltoid    Mfr: GlaxoSmithKline    Dose: 0.5 ml    Route: IM    Given by: Sue Lush McCrimmon CMA, (AAMA)    Exp. Date: 12/14/2010    Lot #: [id number]    VIS given: 01/08/10 version given April 17, 2010.  Flu Vaccine Consent Questions:    Do you have a history of severe allergic reactions to this vaccine? no    Any prior history of allergic reactions to egg and/or gelatin? no    Do you have a sensitivity to the preservative Thimersol? no    Do you have a past history of Guillan-Barre Syndrome? no    Do you currently have an acute febrile illness? no    Have you ever had a severe  reaction to latex? no    Vaccine information given and explained to patient? no    Are you currently pregnant? no   Immunizations Administered:  Influenza Vaccine # 1:    Vaccine Type: Fluvax 3+    Site: left deltoid    Mfr: GlaxoSmithKline    Dose: 0.5 ml    Route: IM    Given by: Sue Lush McCrimmon CMA, (AAMA)    Exp. Date: 12/14/2010    Lot #: [id number]    VIS given: 01/08/10 version given April 17, 2010.

## 2010-07-16 NOTE — Letter (Signed)
[facility]   Imported By: Lanelle Bal 10/08/2009 08:44:40  _____________________________________________________________________  External Attachment:    Type:   Image     Comment:   External Document

## 2010-07-18 NOTE — Letter (Signed)
Summary: [facility]  [facility]   Imported By: Lanelle Bal 07/11/2010 10:36:50  _____________________________________________________________________  External Attachment:    Type:   Image     Comment:   External Document

## 2010-07-22 ENCOUNTER — Ambulatory Visit
Admission: RE | Admit: 2010-07-22 | Discharge: 2010-07-22 | Disposition: A | Discharge status: Home / Self Care | Payer: Self-pay | Source: Ambulatory Visit | Attending: Family Medicine | Admitting: Family Medicine

## 2010-07-22 DIAGNOSIS — Z9289 Personal history of other medical treatment: Secondary | ICD-10-CM

## 2010-09-26 LAB — HEMOGLOBIN AND HEMATOCRIT, BLOOD: Hemoglobin: 12.8 g/dL (ref 12.0–15.0)

## 2010-09-26 LAB — PREGNANCY, URINE: Preg Test, Ur: NEGATIVE

## 2010-10-01 LAB — DIFFERENTIAL
Basophils Absolute: 0.1 10*3/uL (ref 0.0–0.1)
Eosinophils Absolute: 0 10*3/uL (ref 0.0–0.7)
Lymphs Abs: 1.4 10*3/uL (ref 0.7–4.0)
Neutrophils Relative %: 67 % (ref 43–77)

## 2010-10-01 LAB — CBC
HCT: 37.6 % (ref 36.0–46.0)
MCHC: 34.8 g/dL (ref 30.0–36.0)
MCV: 96 fL (ref 78.0–100.0)
Platelets: 288 10*3/uL (ref 150–400)
RDW: 12.2 % (ref 11.5–15.5)
WBC: 5.7 10*3/uL (ref 4.0–10.5)

## 2010-10-01 LAB — COMPREHENSIVE METABOLIC PANEL
ALT: 13 U/L (ref 0–35)
CO2: 25 mEq/L (ref 19–32)
Calcium: 8.8 mg/dL (ref 8.4–10.5)
Chloride: 104 mEq/L (ref 96–112)
Creatinine, Ser: 0.77 mg/dL (ref 0.4–1.2)
GFR calc non Af Amer: >60 mL/min (ref >60)
Glucose, Bld: 106 mg/dL — ABNORMAL HIGH (ref 70–99)
Sodium: 136 mEq/L (ref 135–145)
Total Bilirubin: 0.8 mg/dL (ref 0.3–1.2)

## 2010-10-29 NOTE — Op Note (Signed)
NAME:  Dana [name], Dana [name] NO.:  [id number]   MEDICAL RECORD NO.:  [id number]          PATIENT TYPE:  AMB   LOCATION:  DSC                          FACILITY:  [facility]   PHYSICIAN:  Juanetta Gosling, MDDATE OF BIRTH:  1960/12/23   DATE OF PROCEDURE:  08/02/2008  DATE OF DISCHARGE:                               OPERATIVE REPORT   PREOPERATIVE DIAGNOSIS:  Right breast cancer.   POSTOPERATIVE DIAGNOSIS:  Right breast cancer.   PROCEDURE:  1. Right breast lumpectomy.  2. Injection of Methylene blue dye around periareolar.  3. Right axillary sentinel lymph node biopsy.   SURGEON:  Juanetta Gosling, MD   ASSISTANT:  None.   ANESTHESIA:  General.   FINDINGS:  Two masses of the right breast of the multiple other areas of  thickening that were concerning on exam.  Disposition of this specimen to Pathology.  I discussed case in person  with pathologist.   FINDINGS:  Faxitron with the both clips present.   ESTIMATED BLOOD LOSS:  Minimal.   COMPLICATIONS:  None.   DRAINS:  None.   DISPOSITION:  To recovery room in stable condition.   INDICATIONS:  [name] is a 50 year old female who had palpable  right breast mass underwent mammogram and ultrasound evaluation with 2  masses noted in the lateral portion of her right breast.  These were at  clips placed in both of them and were noted to be invasive ductal  carcinoma with high grade DCIS.  She underwent an MRI that showed only  this region is well.  I cancelled her for right breast lumpectomy with  sentinel lymph node biopsy.  PROCEDURE  After informed consent was taken, the patient was taken to the operating  room where she had first had a tracer injected into her periareolar  region.  She had a 1 g of intravenous cefazolin.  Sequential compression  devices were placed on lower extremities prior to operation.  She then  underwent general anesthesia without complication.  Her right breast was  then  prepped and draped in a standard sterile surgical fashion.  The  areas were noted.  I did note these areas on ultrasound as well.  There  were one other area that was abnormal in her breast that I was concerned  about on exam that did not appear on her radiologic evaluation.  A  surgical timeout was performed.  I made an incision in the  upper-outer quadrant, made flaps in all  directions, and carried this out down to the level of the muscle.  Superiorly, this was done and I encompassed an other area that did not  have clip present previously, but was very concerning on my examination  today.  I then went laterally where her clips were placed and where her  2 masses encompassed  this whole area.  This area was very adherent to  her skin.  I did get very thin in one position and was very concerned  about the skin in this position.  I eventually excised this area .  I  removed this breast tissue, did  a mammogram with a Faxitron to identify  the clips and I only had one of the clips present at this time.  Due to  this, I then took some more additional tissue that was very near to the  skin, did a Faxitron, and this indeed did have the second clip in place.  I then put the gamma probe in axilla through the same incision, was able  to identify 3 sentinel nodes that were hot and blue.  These were  dissected free from the surrounding structures and passed off the table  as specimen.  The counts ranged from 600 to 150 of these specimens.  The  background count upon completion of this was in the single digits.  Hemostasis was observed.  The area right up on the skin were to appear  the cancer, it was very close to the skin.  I did excise this skin and  this was also passed off the table.  I then raised the breast of the  pectoralis muscle in all direction pulled this together with the 2-0  Vicryl.  I then closed the dermis with the 3-0 Vicryl and reapproximated  the wound in a satisfactory fashion.   The skin was closed with 4-0  Monocryl.  There were several positions that I did place 4-0 nylon in as  well due to my concern about her wound and the tissue being very thin  beneath that.  Then a sterile dressing and Steri-Strips were placed on  the rest of this wound.  I infiltrated 10 mL of 0.25% Marcaine plain  upon completion of this.  She tolerated this well, was extubated in the  operating room, and transferred to the recovery room in stable  condition.      Juanetta Gosling, MD  Electronically Signed     MCW/MEDQ  D:  08/02/2008  T:  08/03/2008  Job:  [id number]

## 2010-10-29 NOTE — Op Note (Signed)
NAME:  HEIDE, BROSSART NO.:  [id number]   MEDICAL RECORD NO.:  [id number]          PATIENT TYPE:  OIB   LOCATION:  0098                         [facility]   PHYSICIAN:  Juanetta Gosling, MDDATE OF BIRTH:  1961/05/31   DATE OF PROCEDURE:  08/30/2008  DATE OF DISCHARGE:                               OPERATIVE REPORT   PREOPERATIVE DIAGNOSIS:  Right breast cancer, clinical stage I.   POSTOPERATIVE DIAGNOSIS:  Right breast cancer, clinical stage I.   PROCEDURE:  1. Right modified radical mastectomy.  2. Left subclavian power port insertion.   SURGEON:  Harden Mo, M.D.   ASSISTANT:  Sharlet Salina T. Hoxworth, M.D.   ANESTHESIA:  General.   SPECIMENS:  Right breast with uppermost axillary contents tagged,  disposition is to pathology.   ESTIMATED BLOOD LOSS:  Minimal.   COMPLICATIONS:  None.   DRAINS:  Per Dr. Odis Luster who will do the reconstruction.   DISPOSITION:  Turned over patient to Dr. Odis Luster of plastic surgery for  completion of the operation.   INDICATIONS:  Ms. Troutman is a 51 year old female who noted a palpable  right breast mass on her own exam who was evaluated in breast center,  underwent a mammogram and ultrasound which showed a bilobar mass in the  lateral portion of her breast.  She then underwent ultrasound-guided  vacuum assisted core biopsy with this being invasive ductal carcinoma.  The other lesion was invasive ductal carcinoma with high-grade DCIS.  She underwent a right breast lumpectomy and sentinel lymph node biopsy  on August 02, 2008 with finding of invasive ductal carcinoma, high  grade with 2 foci present at multiple margins.  There were 2  benign  lymph nodes and 2 other lymph nodes with only isolated tumor cells.  She  and I discussed a right mastectomy due to her breast size and out  inability to obtain a cosmetic result with need for further margins.  She had seen Dr. Dayton Scrape and in discussion with Dr. Dayton Scrape,  we all agreed  to perform a right axillary lymph node dissection at the same time.  She  and her husband both understand the plan for surgery and she was also  discussed in multidisciplinary breast conference and due to the poor  prognostic characteristic of her tumor, chemotherapy is going to be  recommended, so we discussed port placement at the same time.   PROCEDURE:  After informed consent was obtained, the patient was taken  to the operating room.  She was administered 1 gram of intravenous  cefazolin.  Sequential compression catheter was then placed.  Both sides  of her chest as well as her right arm were then prepped and draped in  standard sterile surgical fashion.  A surgical time-out was then  performed.   A right breast incision was then made in an elliptical fashion  encompassing her entire previous incision.  This was then developed into  the breast tissue.  Flaps were raised superiorly to the clavicle,  inferiorly to the abdominal musculature, medially to the sternum and  laterally to the latissimus dorsi.  She had  a fair amount of scarring  from her old lumpectomy cavity and I did leave a small portion of her  seroma cavity intact on her pectoralis fascia which I had taken before  at my prior operation.  There were several areas that were bleeding  during this portion of the procedure that were stick tied with a 2-0  Vicryl suture.  The breast was then removed from the muscle with  electrocautery including the remaining pectoralis fascia.  This was then  rolled laterally and dissection was taken down to the axilla.  There was  a fair amount of scarring from her prior sentinel node biopsy.  The  axillary vein was identified.  The axillary contents were then swept  down from the axillary vein.  The long thoracic nerve was identified and  preserved as was the thoracodorsal bundle.  A combination of blunt  dissection and electrocautery was then used to remove the axillary   contents easily.  This was then passed off the table as a specimen.  A  stitch was placed in the upper most of contents of the axilla.  Hemostasis was then obtained with electrocautery at several positions.  Irrigation was performed.  This all appeared to be clean.  Her skin  appeared to be a little bit tight for the closure and will be evaluated  by Dr. Odis Luster as he attempts to place the expander today.  I placed a  moist sponge over the muscle and placed a towel overlying this as we  were done with our portion of the procedure.   I then proceeded to place her in the Trendelenburg position.  I accessed  her left subclavian vein on the first pass with a needle, passed the  wire and confirmed this with fluoroscopy to be in the correct position.  Following this, I inserted the dilator and watched this under  fluoroscopy to be in the correct position as well, without difficulty.  I then made an incision below the insertion point.  I made a pocket for  the port.  The port was then brought up and flushed.  I then took the  unattached tubing, passed this into the dilator and under fluoroscopy  watched this go into the correct position.  This was then cut to  appropriate length after watching it be right near the atrial caval  junction on fluoroscopy.  This was then attached to the port and the  port was placed into position and sutured into 2 places with a 2-0  Prolene suture.  On fluoroscopy, the tip appeared to be near the atrial  caval junction in good position.  The dermis was closed with a 3-0  Vicryl.  Skin was closed with 4-0 Monocryl and Dermabond was placed over  these wounds.  I did flush the port.  It flushed easily and aspirated  blood easily as well.  I then turned over the operation to Dr. Etter Sjogren who was planning on doing the reconstructive phase of this  procedure.      Juanetta Gosling, MD  Electronically Signed     MCW/MEDQ  D:  08/30/2008  T:  08/30/2008   Job:  [id number]   cc:   Maryln Gottron, M.D.  Fax: [phone]   Etter Sjogren, M.D.  Fax: [phone]

## 2010-10-29 NOTE — Op Note (Signed)
NAME:  Dana [name], Dana [name] NO.:  [id number]   MEDICAL RECORD NO.:  [id number]          PATIENT TYPE:  OIB   LOCATION:                               [facility]ACILITY[facility]   PHYSICIAN:  Etter Sjogren, M.D.     DATE OF BIRTH:  [birth year]   DATE OF PROCEDURE:  08/30/2008  DATE OF DISCHARGE:                               OPERATIVE REPORT   PREOPERATIVE DIAGNOSIS:  Right breast cancer.   POSTOPERATIVE DIAGNOSIS:  Right breast cancer.   PROCEDURE PERFORMED:  Right breast reconstruction with a tissue  expander.   SURGEON:  Etter Sjogren, M.D.   ANESTHESIA:  General.   ESTIMATED BLOOD LOSS:  Minimal.   DRAINS:  Three Blake drains were left, 19-French.   CLINICAL NOTE:  A 49 year old woman who has breast cancer presents for a  wider excision with mastectomy today, and desired reconstruction.  Options discussed.  She selected use of a tissue expander, followed  eventually as a planned staged procedure with placement of an implant.   DISPOSITION:  The risks plus complications were well understood by her  and she understood those risks and wished to proceed.   DESCRIPTION OF PROCEDURE:  The mastectomy had been completed without  complications.  The dissection was carried down deep to the pectoralis  major muscle and a submuscular pocket was developed to the inframammary  crease, taking great care to avoid damage to the underlying chest  cavity.  In addition, the serratus anterior was elevated laterally in  order to find enough lateral space as well.  Thorough irrigation with  saline.  Excellent hemostasis was confirmed.  Antibiotic solution was  also used for irrigation.  Blake drains were positioned; one in the  axilla, one under the mastectomy flaps, and one in the submuscular  pocket, brought through separate stab wounds inferolaterally and secured  with 3-0 Prolene suture.  The expander was then prepared after  thoroughly cleaning gloves.  This was a Mentor 133 MV style  500 mL  tissue expander.  The expander was soaked in antibiotic solution.  Then  25 mL of sterile saline was placed using a closed filling system.  The  submuscular pocket checked, hemostasis confirmed, and the expander was  positioned with antibiotic solution placed on the expander again once it  was in place, and after it had been soaked in the antibiotic solution.  The muscle was then closed with 3-0 Vicryl interrupted figure-of-eight  sutures, taking great care to avoid damage to the underlying expander.  Again, antibiotic solution placed on the expander just prior to  completion of this submuscular closure.  The antibiotic solution was  placed in the mastectomy space and the closure with 3-0 and 2-0 Monocryl  inverted deep dermal sutures, running 3-  0 Monocryl subcuticular suture.  The skin edges had excellent color and  appeared to be viable.  Steri-Strips, dry sterile dressings, a  circumferential Ace wrap applied.  She was transferred to the recovery  room stable, having tolerated the procedure well.      Etter Sjogren, M.D.  Electronically Signed     DB/MEDQ  D:  08/30/2008  T:  08/30/2008  Job:  [id number]   cc:   Etter Sjogren, M.D.  Fax: [phone]

## 2010-12-17 ENCOUNTER — Telehealth: Payer: Self-pay | Admitting: Family Medicine

## 2010-12-17 DIAGNOSIS — Z1211 Encounter for screening for malignant neoplasm of colon: Secondary | ICD-10-CM

## 2010-12-17 NOTE — Telephone Encounter (Signed)
Yes she does need colonoscopy for colon cancer screening. We can set her up here in [place] or if she has another preference please let me know. I will go ahead and get the referral going.

## 2010-12-17 NOTE — Telephone Encounter (Signed)
Pt called and LMOM for triage nurse.  Stated she had a CPE in 11/11.  She is over 49 and had history of breast cancer and there was no recommendation for colonoscopy.  Pt would like to know if she should have a colonoscopy scheduled?  Please advise. Plan:  Routed to Dr. Linford Arnold for order or recommendation Jarvis Newcomer, LPN [facility]

## 2010-12-19 NOTE — Telephone Encounter (Signed)
LMOM for the patient to call the triage nurse and leave detailed message on recorder stating where she would like to go for screening colonoscopy. Jarvis Newcomer, LPN [facility]

## 2010-12-19 NOTE — Telephone Encounter (Signed)
Dana [name] pt prefers to do her colonoscopy here at the [place] [facility].  Thanks. Routed to [facility]. Jarvis Newcomer, LPN [facility]

## 2010-12-19 NOTE — Telephone Encounter (Signed)
I have already put the order in so please let Victorino Dike know pt's preference.

## 2010-12-19 NOTE — Telephone Encounter (Signed)
Pt wants to do her colonoscopy in [place] here at the center. Routed to Dr. Marlyne Beards, LPN [facility]

## 2011-01-07 ENCOUNTER — Other Ambulatory Visit: Payer: Self-pay | Admitting: Oncology

## 2011-01-07 ENCOUNTER — Encounter (HOSPITAL_BASED_OUTPATIENT_CLINIC_OR_DEPARTMENT_OTHER): Payer: 59 | Admitting: Oncology

## 2011-01-07 DIAGNOSIS — C50419 Malignant neoplasm of upper-outer quadrant of unspecified female breast: Secondary | ICD-10-CM

## 2011-01-07 LAB — CBC WITH DIFFERENTIAL/PLATELET
BASO%: 1 % (ref 0.0–2.0)
Eosinophils Absolute: 0.1 10*3/uL (ref 0.0–0.5)
LYMPH%: 40.4 % (ref 14.0–49.7)
MCHC: 33.5 g/dL (ref 31.5–36.0)
MONO#: 0.4 10*3/uL (ref 0.1–0.9)
NEUT#: 1.8 10*3/uL (ref 1.5–6.5)
Platelets: 267 10*3/uL (ref 145–400)
RBC: 4.46 10*6/uL (ref 3.70–5.45)
RDW: 12 % (ref 11.2–14.5)
WBC: 3.8 10*3/uL — ABNORMAL LOW (ref 3.9–10.3)
lymph#: 1.5 10*3/uL (ref 0.9–3.3)

## 2011-01-07 LAB — COMPREHENSIVE METABOLIC PANEL
ALT: 11 U/L (ref 0–35)
Albumin: 4.6 g/dL (ref 3.5–5.2)
CO2: 27 mEq/L (ref 19–32)
Calcium: 9.1 mg/dL (ref 8.4–10.5)
Chloride: 101 mEq/L (ref 96–112)
Glucose, Bld: 102 mg/dL — ABNORMAL HIGH (ref 70–99)
Potassium: 4.5 mEq/L (ref 3.5–5.3)
Sodium: 138 mEq/L (ref 135–145)
Total Protein: 7.1 g/dL (ref 6.0–8.3)

## 2011-01-07 LAB — FOLLICLE STIMULATING HORMONE: FSH: 36.7 m[IU]/mL

## 2011-01-13 LAB — ESTRADIOL, ULTRA SENS: Estradiol, Ultra Sensitive: 4 pg/mL

## 2011-04-24 ENCOUNTER — Telehealth: Payer: Self-pay | Admitting: Oncology

## 2011-04-24 NOTE — Telephone Encounter (Signed)
called pt and informed her that her appt in dec2012 was cancelled.  r/s appt 06/26/11 and 07/03/2011

## 2011-05-29 ENCOUNTER — Other Ambulatory Visit: Payer: Self-pay | Admitting: Family Medicine

## 2011-06-26 ENCOUNTER — Other Ambulatory Visit: Payer: Self-pay | Admitting: Oncology

## 2011-06-26 ENCOUNTER — Other Ambulatory Visit (HOSPITAL_BASED_OUTPATIENT_CLINIC_OR_DEPARTMENT_OTHER): Payer: 59 | Admitting: Lab

## 2011-06-26 DIAGNOSIS — Z5112 Encounter for antineoplastic immunotherapy: Secondary | ICD-10-CM

## 2011-06-26 DIAGNOSIS — C50419 Malignant neoplasm of upper-outer quadrant of unspecified female breast: Secondary | ICD-10-CM

## 2011-06-26 LAB — CBC WITH DIFFERENTIAL/PLATELET
Basophils Absolute: 0.1 10*3/uL (ref 0.0–0.1)
EOS%: 0.8 % (ref 0.0–7.0)
Eosinophils Absolute: 0 10*3/uL (ref 0.0–0.5)
HCT: 40.3 % (ref 34.8–46.6)
HGB: 13.7 g/dL (ref 11.6–15.9)
MCH: 32.6 pg (ref 25.1–34.0)
MCV: 95.7 fL (ref 79.5–101.0)
MONO%: 8.6 % (ref 0.0–14.0)
NEUT#: 3.5 10*3/uL (ref 1.5–6.5)
NEUT%: 60.3 % (ref 38.4–76.8)
Platelets: 288 10*3/uL (ref 145–400)

## 2011-06-26 LAB — COMPREHENSIVE METABOLIC PANEL
AST: 20 U/L (ref 0–37)
Albumin: 4.6 g/dL (ref 3.5–5.2)
Alkaline Phosphatase: 53 U/L (ref 39–117)
BUN: 10 mg/dL (ref 6–23)
Calcium: 9.1 mg/dL (ref 8.4–10.5)
Creatinine, Ser: 0.65 mg/dL (ref 0.50–1.10)
Glucose, Bld: 92 mg/dL (ref 70–99)

## 2011-06-30 ENCOUNTER — Other Ambulatory Visit: Payer: Self-pay | Admitting: Family Medicine

## 2011-06-30 DIAGNOSIS — Z9011 Acquired absence of right breast and nipple: Secondary | ICD-10-CM

## 2011-06-30 DIAGNOSIS — Z1231 Encounter for screening mammogram for malignant neoplasm of breast: Secondary | ICD-10-CM

## 2011-07-03 ENCOUNTER — Ambulatory Visit (HOSPITAL_BASED_OUTPATIENT_CLINIC_OR_DEPARTMENT_OTHER): Payer: 59 | Admitting: Physician Assistant

## 2011-07-03 ENCOUNTER — Encounter: Payer: Self-pay | Admitting: Physician Assistant

## 2011-07-03 ENCOUNTER — Telehealth: Payer: Self-pay | Admitting: Oncology

## 2011-07-03 VITALS — BP 126/81 | HR 67 | Temp 98.5°F | Ht 68.0 in | Wt 141.1 lb

## 2011-07-03 DIAGNOSIS — Z9221 Personal history of antineoplastic chemotherapy: Secondary | ICD-10-CM

## 2011-07-03 DIAGNOSIS — Z853 Personal history of malignant neoplasm of breast: Secondary | ICD-10-CM

## 2011-07-03 DIAGNOSIS — R921 Mammographic calcification found on diagnostic imaging of breast: Secondary | ICD-10-CM

## 2011-07-03 DIAGNOSIS — Z9010 Acquired absence of unspecified breast and nipple: Secondary | ICD-10-CM

## 2011-07-03 DIAGNOSIS — C50911 Malignant neoplasm of unspecified site of right female breast: Secondary | ICD-10-CM

## 2011-07-03 HISTORY — DX: Mammographic calcification found on diagnostic imaging of breast: R92.1

## 2011-07-03 NOTE — Telephone Encounter (Signed)
gve the pt her July 2013 appt calendar °

## 2011-07-03 NOTE — Progress Notes (Signed)
Hematology and Oncology Follow Up Visit  Dana [name] [id number] 1961-01-28 51 y.o. 07/03/2011    HPI: Dana [name] felt a palpable lesion in the right breast towards the end of January and brought it to Dr. Shelah Lewandowsky attention.  Dr. Linford Arnold set her up at [facility] for diagnostic mammography and right breast ultrasound, which was performed 07/14/2008.  The mammogram showed a 10-mm irregular mass in the upper right breast and also a separate cluster of coarse calcifications.  By exam, Dr. Si Gaul was able to find a firm palpable mass in the 10 o'clock position, 8 cm from the nipple, which by ultrasound measured 9 mm, was hypoechoic and irregular.  There was also a 12-mm irregular bilobed mass at a separate position.  The 2 masses, however, were separated only by 6 mm.  Both masses were biopsied on the same day and this showed (PM10-75 and PM10-76 as well as [id number]) invasive ductal carcinoma, both being ER and PR negative, and both having a proliferation marker of 74% with HER-2 negative by CISH with a ratio of 1.25 and 1.48.    With this information, the patient was referred to Dr. Dwain Sarna and bilateral breast MRIs were obtained on 07/19/2008.  This showed 2 areas of abnormal enhancement in the right breast, corresponding to the prior areas noted.  They appeared confluent and measured 2.6-cm in maximum extent, but really there were 2 masses, 1 being 1.4 cm and the other one 1 cm.  With this information, the patient proceeded to right lumpectomy and sentinel lymph node sampling on 08/02/2008 and this showed (S10-855 and PM10-126) multifocal invasive ductal carcinoma, the 2 foci measuring 1.4 and 1.2 cm, in the setting of ductal carcinoma in situ.  Margins were positive, there was no lymphovascular invasion, grade was 3, and 2 out of 4 lymph nodes examined had isolated tumor cells only.  The prognostic panel was repeated and was again ER and PR negative.  The CISH was again negative at 1.01.  Dana [name]  had 1 final surgery on 08/30/2008.  This was right modified radical mastectomy with no residual tumor in the breast, and no tumor identified in 12 additional axillary lymph nodes, so she had a total of 16 removed.  Surgery was followed by adjuvant chemotherapy, and the patient was treated according to the ECOG 5103 protocol, with 4 cycles of dose dense doxorubicin/cyclophosphamide given with bevacizumab, followed by 12 weekly doses of paclitaxel also given with bevacizumab. Chemotherapy was completed in September 2010. Patient is followed with observation alone.  Interim History:   Dana [name] returns today for routine followup of her right breast carcinoma. While she's doing very well physically, she has a lot of stress in her life these days. Her 64 year old father has Alzheimer's, and is being cared for by her 60 year old mother who recently took a bad fall, with some resulting vertebral fractures. Her parents live in [place], in chair and is traveling back and forth helping to take care of them. She wonders if perhaps she will be moving them to [facility] setting.  Physically, she really has very few complaints, however. Her energy level is good. She's had no recent illnesses or fevers. She does have some occasional hot flashes which are minimal and not particularly problematic. She does have a history of depression with some anxiety and continues on Prozac. She feels that this is very well-controlled, however, and denies any suicidal ideations. She is up-to-date with all of her health maintenance.  A detailed review of  systems is otherwise noncontributory as noted below.  Review of Systems: Constitutional:  no weight loss, fever, night sweats and feels well Eyes: negative [id number] Cardiovascular: no chest pain or dyspnea on exertion Respiratory: no cough, shortness of breath, or wheezing Neurological: negative Dermatological: negative Gastrointestinal: no abdominal pain, change in  bowel habits, or black or bloody stools Genito-Urinary: no dysuria, trouble voiding, or hematuria Hematological and Lymphatic: negative Breast: negative Musculoskeletal: negative Remaining ROS negative.  FAMILY HISTORY:  The patient's father is alive at age 45, with Alzheimer's.  The patient's mother is alive at age 37; she has a history of throat cancer.  The patient has 3 siblings in good health.  There is no history of breast or ovarian cancer in the family.  GYNECOLOGIC HISTORY:  She is GX P2, first pregnancy to term age 87.   SOCIAL HISTORY:  Jelisha moved here from [place] because of Jeff's job.  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  Her 2 children are Dana [name], 18, who attends college at [employer],  and Dana [name], Dana [name], who is at home.  The patient attends a non-denominational church.  Medications:   I have reviewed the patient's current medications.  Current Outpatient Prescriptions  Medication Sig Dispense Refill  . calcium carbonate (OS-CAL) 600 MG TABS Take 600 mg by mouth daily.      Marland Kitchen FLUoxetine (PROZAC) 10 MG capsule TAKE 3 CAPSULES DAILY  270 capsule  2  . Multiple Vitamin (MULTIVITAMIN) tablet Take 1 tablet by mouth daily.        Allergies: No Known Allergies  Physical Exam: Filed Vitals:   07/03/11 0952  BP: 126/81  Pulse: 67  Temp: 98.5 F (36.9 C)   HEENT:  Sclerae anicteric, conjunctivae pink.  Oropharynx clear.  No mucositis or candidiasis.   Nodes:  No cervical, supraclavicular, or axillary lymphadenopathy palpated.  Breast Exam:  Patient is status post right mastectomy with reconstruction.Nodularity, skin changes, or local recurrence in the chest wall. Left breast is benign, no masses, skin changes, or nipple  inversion. Lungs:  Clear to auscultation bilaterally.  No crackles, rhonchi, or wheezes.   Heart:  Regular rate and rhythm.   Abdomen:  Soft, nontender.  Positive bowel sounds.  No organomegaly or masses palpated.   Musculoskeletal:  No focal spinal tenderness to palpation.  Extremities:  Benign.  No peripheral edema or cyanosis.   Skin:  Benign.   Neuro:  Nonfocal.   Lab Results: Lab Results  Component Value Date   WBC 5.8 06/26/2011   HGB 13.7 06/26/2011   HCT 40.3 06/26/2011   MCV 95.7 06/26/2011   PLT 288 06/26/2011   NEUTROABS 3.5 06/26/2011     Chemistry      Component Value Date/Time   NA 136 06/26/2011 1335   NA 136 06/26/2011 1335   K 4.7 06/26/2011 1335   K 4.7 06/26/2011 1335   CL 101 06/26/2011 1335   CL 101 06/26/2011 1335   CO2 25 06/26/2011 1335   CO2 25 06/26/2011 1335   BUN 10 06/26/2011 1335   BUN 10 06/26/2011 1335   CREATININE 0.65 06/26/2011 1335   CREATININE 0.65 06/26/2011 1335      Component Value Date/Time   CALCIUM 9.1 06/26/2011 1335  CALCIUM 9.1 06/26/2011 1335   ALKPHOS 53 06/26/2011 1335   ALKPHOS 53 06/26/2011 1335   AST 20 06/26/2011 1335   AST 20 06/26/2011 1335   ALT 13 06/26/2011 1335   ALT 13 06/26/2011 1335   BILITOT 0.4 06/26/2011 1335   BILITOT 0.4 06/26/2011 1335        Radiological Studies:  No results found.  Patient scheduled for her annual mammogram on February 8.  Assessment:  A 51 year old [facility] woman   (1)  status post right modified radical mastectomy March 2010 for multifocal breast cancer, the larger of 2 lesions measuring 1.4 cm, grade 3, no lymph node involved, triple negative, with an elevated MIB-1.   (2)  The patient was treated according to ECOG 5103 with 4 cycles of dose-dense doxorubicin/cyclophosphamide given with bevacizumab, followed by 12 weekly doses of Taxol also given with bevacizumab.  All this was completed in September 2010.   Plan:  Reeya continues to do well, and there is no clinical evidence of disease  recurrence. She'll have her mammogram scheduled in early February, and we will continue to see her here on a 6 month schedule. She returned see Dr. Darnelle Catalan in July of 2013.   This plan was reviewed with the patient, who voices understanding and agreement.  She knows to call with any changes or problems.    Avnoor Koury, PA-C 07/03/2011

## 2011-07-10 ENCOUNTER — Other Ambulatory Visit (HOSPITAL_COMMUNITY)
Admission: RE | Admit: 2011-07-10 | Discharge: 2011-07-10 | Disposition: A | Discharge status: Home / Self Care | Payer: 59 | Source: Ambulatory Visit | Attending: Family Medicine | Admitting: Family Medicine

## 2011-07-10 ENCOUNTER — Ambulatory Visit (INDEPENDENT_AMBULATORY_CARE_PROVIDER_SITE_OTHER): Payer: 59 | Admitting: Family Medicine

## 2011-07-10 ENCOUNTER — Encounter: Payer: Self-pay | Admitting: Family Medicine

## 2011-07-10 VITALS — BP 107/70 | HR 66 | Temp 98.2°F | Wt 141.0 lb

## 2011-07-10 DIAGNOSIS — Z01419 Encounter for gynecological examination (general) (routine) without abnormal findings: Secondary | ICD-10-CM

## 2011-07-10 DIAGNOSIS — R6882 Decreased libido: Secondary | ICD-10-CM

## 2011-07-10 DIAGNOSIS — Z23 Encounter for immunization: Secondary | ICD-10-CM

## 2011-07-10 MED ORDER — AMBULATORY NON FORMULARY MEDICATION: Status: DC

## 2011-07-10 NOTE — Progress Notes (Signed)
Subjective:     Dana [name] is a 51 y.o. female and is here for a comprehensive physical exam. The patient reports no problems.  She does complain of decreased libido. She has been going on for quite some time. She can wean off her SSRIs over the last year and hopes that this would improve her sexual dysfunction. She is now down to 10 mg of fluoxetine and hopefully will be off of it in the next month. She did take Wellbutrin at one time in the past determine why she stopped it.  History   Social History  . Marital Status: Married    Spouse Name: Trey Paula.     Number of Children: 2  . Years of Education: N/A   Occupational History  . works for [employer]     Social History Main Topics  . Smoking status: Never Smoker   . Smokeless tobacco: Never Used  . Alcohol Use: 1.8 - 2.4 oz/week    3-4 Glasses of wine per week  . Drug Use: No  . Sexually Active: Not on file   Other Topics Concern  . Not on file   Social History Narrative   No regular exercise. 2-3 caffeine drinks per day.    Health Maintenance  Topic Date Due  . Influenza Vaccine  03/16/2012  . Mammogram  07/22/2012  . Colonoscopy  12/31/2013  . Pap Smear  07/09/2014  . Tetanus/tdap  07/09/2021    The following portions of the patient's history were reviewed and updated as appropriate: allergies, current medications, past family history, past medical history, past social history, past surgical history and problem list.  Review of Systems A comprehensive review of systems was negative.   Objective:    BP 107/70  Pulse 66  Temp(Src) 98.2 F (36.8 C) (Oral)  Wt 141 lb (63.957 kg)  SpO2 100% General appearance: alert, cooperative and appears stated age Head: Normocephalic, without obvious abnormality, atraumatic Eyes: conj clear, EOMi, PEERLA Ears: normal TM's and external ear canals both ears Nose: Nares normal. Septum midline. Mucosa normal. No drainage or sinus tenderness. Throat: lips, mucosa, and tongue  normal; teeth and gums normal Neck: no adenopathy, no carotid bruit, no JVD, supple, symmetrical, trachea midline and thyroid not enlarged, symmetric, no tenderness/mass/nodules Back: symmetric, no curvature. ROM normal. No CVA tenderness. Lungs: clear to auscultation bilaterally Heart: regular rate and rhythm, S1, S2 normal, no murmur, click, rub or gallop Abdomen: soft, non-tender; bowel sounds normal; no masses,  no organomegaly Pelvic: cervix normal in appearance, external genitalia normal, no adnexal masses or tenderness, no cervical motion tenderness, rectovaginal septum normal, uterus normal size, shape, and consistency and vagina normal without discharge Extremities: extremities normal, atraumatic, no cyanosis or edema Pulses: 2+ and symmetric Skin: Skin color, texture, turgor normal. No rashes or lesions Lymph nodes: NO cervical LN.  Neurologic: Grossly normal    Assessment:    Healthy female exam.      Plan:     See After Visit Summary for Counseling Recommendations  Start a regular exercise program and make sure you are eating a healthy diet Try to eat 4 servings of dairy a day or take a calcium supplement (500mg  twice a day). Your vaccines are up to date.  Given lab slip to check labs.   Decreased libido - will check testosterone level. Also discussed wellbutrin as a possibility to help with decreased libido. She was hoping when weaned down on the SSRIs would help. I would expect that only 10  mg of fluoxetine should not be causing significant sexual dysfunction at this point. We also discussed a trial of DHEA applied to the clitoris daily to see if this would also help with stimulation, etc. [facility]  Will also check TSH.

## 2011-07-10 NOTE — Patient Instructions (Addendum)
Start a regular exercise program and make sure you are eating a healthy diet Try to eat 4 servings of dairy a day or take a calcium supplement (500mg twice a day). Your vaccines are up to date.  We will call you with your lab results. If you don't here from us in about a week then please give us a call at [phone].  

## 2011-07-11 LAB — CBC
HCT: 42.5 % (ref 36.0–46.0)
Hemoglobin: 13.7 g/dL (ref 12.0–15.0)
MCH: 31.6 pg (ref 26.0–34.0)
MCV: 97.9 fL (ref 78.0–100.0)
RBC: 4.34 MIL/uL (ref 3.87–5.11)

## 2011-07-11 LAB — COMPLETE METABOLIC PANEL WITH GFR
AST: 18 U/L (ref 0–37)
Albumin: 4.6 g/dL (ref 3.5–5.2)
Alkaline Phosphatase: 54 U/L (ref 39–117)
BUN: 9 mg/dL (ref 6–23)
Potassium: 4.4 mEq/L (ref 3.5–5.3)
Sodium: 141 mEq/L (ref 135–145)

## 2011-07-11 LAB — LIPID PANEL
Cholesterol: 205 mg/dL — ABNORMAL HIGH (ref 0–200)
Total CHOL/HDL Ratio: 3.2 Ratio
VLDL: 11 mg/dL (ref 0–40)

## 2011-07-25 ENCOUNTER — Ambulatory Visit
Admission: RE | Admit: 2011-07-25 | Discharge: 2011-07-25 | Disposition: A | Discharge status: Home / Self Care | Payer: 59 | Source: Ambulatory Visit | Attending: Family Medicine | Admitting: Family Medicine

## 2011-07-25 DIAGNOSIS — Z9011 Acquired absence of right breast and nipple: Secondary | ICD-10-CM

## 2011-07-25 DIAGNOSIS — Z1231 Encounter for screening mammogram for malignant neoplasm of breast: Secondary | ICD-10-CM

## 2011-12-29 ENCOUNTER — Other Ambulatory Visit (HOSPITAL_BASED_OUTPATIENT_CLINIC_OR_DEPARTMENT_OTHER): Payer: 59 | Admitting: Lab

## 2011-12-29 DIAGNOSIS — C50919 Malignant neoplasm of unspecified site of unspecified female breast: Secondary | ICD-10-CM

## 2011-12-29 DIAGNOSIS — C50911 Malignant neoplasm of unspecified site of right female breast: Secondary | ICD-10-CM

## 2011-12-29 LAB — CBC WITH DIFFERENTIAL/PLATELET
BASO%: 1.8 % (ref 0.0–2.0)
EOS%: 0.8 % (ref 0.0–7.0)
HCT: 39.3 % (ref 34.8–46.6)
LYMPH%: 32.7 % (ref 14.0–49.7)
MCH: 32.4 pg (ref 25.1–34.0)
MCHC: 33.4 g/dL (ref 31.5–36.0)
MCV: 96.9 fL (ref 79.5–101.0)
MONO#: 0.4 10*3/uL (ref 0.1–0.9)
NEUT%: 55 % (ref 38.4–76.8)
Platelets: 254 10*3/uL (ref 145–400)

## 2011-12-29 LAB — COMPREHENSIVE METABOLIC PANEL
AST: 21 U/L (ref 0–37)
BUN: 10 mg/dL (ref 6–23)
Calcium: 9.3 mg/dL (ref 8.4–10.5)
Chloride: 101 mEq/L (ref 96–112)
Creatinine, Ser: 0.62 mg/dL (ref 0.50–1.10)

## 2012-01-05 ENCOUNTER — Telehealth: Payer: Self-pay | Admitting: Oncology

## 2012-01-05 ENCOUNTER — Ambulatory Visit (HOSPITAL_BASED_OUTPATIENT_CLINIC_OR_DEPARTMENT_OTHER): Payer: 59 | Admitting: Oncology

## 2012-01-05 VITALS — BP 105/68 | HR 63 | Temp 98.7°F | Ht 68.0 in | Wt 150.0 lb

## 2012-01-05 DIAGNOSIS — C50911 Malignant neoplasm of unspecified site of right female breast: Secondary | ICD-10-CM

## 2012-01-05 DIAGNOSIS — C50919 Malignant neoplasm of unspecified site of unspecified female breast: Secondary | ICD-10-CM

## 2012-01-05 NOTE — Progress Notes (Signed)
ID: Dana Dana [name]   DOB: 1961-01-04  MR#: [id number]  WUJ#:[id number]  HISTORY OF PRESENT ILLNESS: She felt a palpable lesion in the right breast towards the end of January and brought it to Dr. Shelah Dana [name] attention.  Dr. Linford [name] set her up at [facility] for diagnostic mammography and right breast ultrasound, which was performed 07/14/2008.  The mammogram showed a 10-mm irregular mass in the upper right breast and also a separate cluster of coarse calcifications.  By exam, Dr. Si [name] was able to find a firm palpable mass in the 10 o'clock position, 8 cm from the nipple, which by ultrasound measured 9 mm, was hypoechoic and irregular.  There was also a 12-mm irregular bilobed mass at a separate position.  The 2 masses, however, were separated only by 6 mm.  Both masses were biopsied on the same day and this showed (PM10-75 and PM10-76 as well as [id number]) invasive ductal carcinoma, both being ER and PR negative, and both having a proliferation marker of 74% with HER-2 negative by CISH with a ratio of 1.25 and 1.48.    With this information, the patient was referred to Dr. Dwain [name] and bilateral breast MRIs were obtained on 07/19/2008.  This showed 2 areas of abnormal enhancement in the right breast, corresponding to the prior areas noted.  They appeared confluent and measured 2.6-cm in maximum extent, but really there were 2 masses, 1 being 1.4 cm and the other one 1 cm.  With this information, the patient proceeded to right lumpectomy and sentinel lymph node sampling on 08/02/2008 and this showed (S10-855 and PM10-126) multifocal invasive ductal carcinoma, the 2 foci measuring 1.4 and 1.2 cm, in the setting of ductal carcinoma in situ.  Margins were positive, there was no lymphovascular invasion, grade was 3, and 2 out of 4 lymph nodes examined had isolated tumor cells only.  The prognostic panel was repeated and was again ER and PR negative.  The CISH was again negative at 1.01.  The patient had 1  final surgery on 08/30/2008.  This was right modified radical mastectomy with no residual tumor in the breast, and no tumor identified in 12 additional axillary lymph nodes, so she had a total of 16 removed. Her subsequent history is as detailed below.  INTERVAL HISTORY: Dana Dana [name] returns for routine followup of her breast cancer. The interval history is generally unremarkable. The worst concern she has is her dad, who is in his 84s and with increasing Alzheimer symptoms. He and Dana Dana [name] live in [place], and she tries to get their at least one week and the month. She does have a brother in the area.  REVIEW OF SYSTEMS: She has mild hot flashes, which she says are barely worth mentioning. She is not exercising as much as she would like. Overall a detailed review of systems today was entirely negative.  PAST MEDICAL HISTORY: Past Medical History  Diagnosis Date  . Breast calcification, right 07/03/2011    PAST SURGICAL HISTORY: Past Surgical History  Procedure Date  . Mastectomy, radical 2010    right     FAMILY HISTORY Family History  Problem Relation Age of Onset  . Esophageal cancer Dana [name]   . Hypertension Father   . Coronary artery disease Father 73  The patient's father is alive at age 70.  He has significant ALZ disease.The patient's Dana [name] is alive at age 62; she has a history of throat cancer.  The patient has 3 siblings in good health.  There is  no history of breast or ovarian cancer in the family.  GYNECOLOGIC HISTORY: She is GX P2, first pregnancy to term age 23.  Stopped menstruating with her chemotherapy.   SOCIAL HISTORY: Dana Dana [name] because of Dana [name]'s job.  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  Her 2  children are Christiane [name], 19, and Dana [name], [place].  The patient attends a non-denominational church.   ADVANCED DIRECTIVES: In place  HEALTH MAINTENANCE: History  Substance Use Topics  . Smoking status: Never Smoker   . Smokeless tobacco: Never Used  . Alcohol Use: 1.8 - 2.4 oz/week    3-4 Glasses of wine per week     Colonoscopy:  PAP:  Bone density:  Lipid panel:  No Known Allergies  Current Outpatient Prescriptions  Medication Sig Dispense Refill  . AMBULATORY NON FORMULARY MEDICATION Medication Name: DHEA 5mg /0.45ml .  Apply 1/2 ml on clitoris after shower once daily.  15 mL  6  . calcium carbonate (OS-CAL) 600 MG TABS Take 600 mg by mouth daily.      Marland Kitchen FLUoxetine (PROZAC) 10 MG capsule Take 10 mg by mouth daily.      Marland Kitchen FLUoxetine (PROZAC) 10 MG capsule       . Multiple Vitamin (MULTIVITAMIN) tablet Take 1 tablet by mouth daily.        OBJECTIVE: Middle-aged white woman who appears well Filed Vitals:   01/05/12 1006  BP: 105/68  Pulse: 63  Temp: 98.7 F (37.1 C)     Body mass index is 22.81 kg/(m^2).    ECOG FS: 0  Sclerae unicteric Oropharynx clear No cervical or supraclavicular adenopathy Lungs no rales or rhonchi Heart regular rate and rhythm Abd benign MSK no focal spinal tenderness, no peripheral edema Neuro: nonfocal Breasts: The right breast is status post mastectomy with implant reconstruction. There is no evidence of local recurrence. The left breast is unremarkable the  LAB RESULTS: Lab Results  Component Value Date   WBC 4.2 12/29/2011   NEUTROABS 2.3 12/29/2011   HGB 13.1 12/29/2011   HCT 39.3 12/29/2011   MCV 96.9 12/29/2011   PLT 254 12/29/2011      Chemistry      Component Value Date/Time   NA 139 12/29/2011 0943   K 4.5 12/29/2011 0943   CL 101 12/29/2011 0943   CO2 28 12/29/2011 0943   BUN 10 12/29/2011 0943   CREATININE 0.62 12/29/2011 0943   CREATININE 0.65 07/10/2011 1117      Component Value Date/Time   CALCIUM 9.3 12/29/2011 0943   ALKPHOS 45  12/29/2011 0943   AST 21 12/29/2011 0943   ALT 15 12/29/2011 0943   BILITOT 0.7 12/29/2011 0943       Lab Results  Component Value Date   LABCA2 5 12/29/2011    No components found with this basename: [id number]    No results found for this basename: INR:1;PROTIME:1 in the last 168 hours  Urinalysis No results found for this basename: colorurine, appearanceur, labspec, phurine, glucoseu, hgbur, bilirubinur, ketonesur, proteinur, urobilinogen, nitrite, leukocytesur    STUDIES: Left mammogram 06/30/2011 was unremarkable.  ASSESSMENT: 51 y.o. [facility] woman status post right modified radical mastectomy March 2010 for multifocal breast cancer, the larger of 2 lesions measuring  1.4 cm, grade 3, no lymph node involved, triple negative, with an elevated MIB-1.  The patient was treated according to ECOG 5103 with 4 cycles of dose-dense doxorubicin /cyclophosphamide given with bevacizumab, followed by 12 weekly doses of Taxol also given with bevacizumab.  All this was completed in September 2010.   PLAN: She is doing terrific from a breast cancer point of view, and at this point I am comfortable seeing her on a once a year basis. It will be in March. I did suggest she read The 36-hour Day, which may help orient her to some of the difficulties as well as resources relating to Alzheimer's disease. She knows to call for any problems that may develop before the next visit.   MAGRINAT,GUSTAV C    01/05/2012

## 2012-01-05 NOTE — Telephone Encounter (Signed)
gve the pt her march 2014 appt calendar °

## 2012-04-05 ENCOUNTER — Other Ambulatory Visit: Payer: Self-pay | Admitting: *Deleted

## 2012-04-05 MED ORDER — FLUOXETINE HCL 10 MG PO CAPS: 10.0000 mg | ORAL_CAPSULE | Freq: Every day | ORAL | Status: DC

## 2012-04-07 ENCOUNTER — Other Ambulatory Visit: Payer: Self-pay | Admitting: *Deleted

## 2012-07-13 ENCOUNTER — Ambulatory Visit (INDEPENDENT_AMBULATORY_CARE_PROVIDER_SITE_OTHER): Payer: 59 | Admitting: Family Medicine

## 2012-07-13 VITALS — BP 118/76 | HR 61 | Resp 16 | Ht 67.25 in | Wt 140.0 lb

## 2012-07-13 DIAGNOSIS — R29890 Loss of height: Secondary | ICD-10-CM

## 2012-07-13 DIAGNOSIS — Z853 Personal history of malignant neoplasm of breast: Secondary | ICD-10-CM

## 2012-07-13 DIAGNOSIS — Z780 Asymptomatic menopausal state: Secondary | ICD-10-CM

## 2012-07-13 DIAGNOSIS — Z0000 Encounter for general adult medical examination without abnormal findings: Secondary | ICD-10-CM

## 2012-07-13 DIAGNOSIS — Z85828 Personal history of other malignant neoplasm of skin: Secondary | ICD-10-CM

## 2012-07-13 DIAGNOSIS — Z8262 Family history of osteoporosis: Secondary | ICD-10-CM

## 2012-07-13 LAB — COMPLETE METABOLIC PANEL WITH GFR
AST: 16 U/L (ref 0–37)
Alkaline Phosphatase: 53 U/L (ref 39–117)
BUN: 7 mg/dL (ref 6–23)
GFR, Est Non African American: >89 mL/min
Glucose, Bld: 95 mg/dL (ref 70–99)
Potassium: 4.2 mEq/L (ref 3.5–5.3)
Sodium: 139 mEq/L (ref 135–145)
Total Bilirubin: 0.6 mg/dL (ref 0.3–1.2)

## 2012-07-13 LAB — LIPID PANEL
Cholesterol: 223 mg/dL — ABNORMAL HIGH (ref 0–200)
HDL: 72 mg/dL (ref >39)
Total CHOL/HDL Ratio: 3.1 Ratio
Triglycerides: 67 mg/dL (ref <150)
VLDL: 13 mg/dL (ref 0–40)

## 2012-07-13 MED ORDER — FLUOXETINE HCL 10 MG PO CAPS: 20.0000 mg | ORAL_CAPSULE | Freq: Every day | ORAL | Status: DC

## 2012-07-13 NOTE — Progress Notes (Signed)
Subjective:     Dana [name] is a 52 y.o. female and is here for a comprehensive physical exam. The patient reports no problems. Last Pap smear was one year ago. It was normal. No recent changes. No pelvic pain or problems. No new sex partners. She has followup with her oncologist this spring. After that she will start to see him once a year.  History   Social History  . Marital Status: Married    Spouse Name: Trey Paula.     Number of Children: 2  . Years of Education: N/A   Occupational History  . works for [employer]     Social History Main Topics  . Smoking status: Never Smoker   . Smokeless tobacco: Never Used  . Alcohol Use: 1.8 - 2.4 oz/week    3-4 Glasses of wine per week  . Drug Use: No  . Sexually Active: Not on file   Other Topics Concern  . Not on file   Social History Narrative   No regular exercise. 2-3 caffeine drinks per day.    Health Maintenance  Topic Date Due  . Influenza Vaccine  02/14/2013  . Mammogram  07/24/2013  . Colonoscopy  12/31/2013  . Pap Smear  07/09/2014  . Tetanus/tdap  07/09/2021    The following portions of the patient's history were reviewed and updated as appropriate: allergies, current medications, past family history, past medical history, past social history, past surgical history and problem list.  Review of Systems A comprehensive review of systems was negative.   Objective:    BP 118/76  Pulse 61  Resp 16  Ht 5' 7.25" (1.708 m)  Wt 140 lb (63.504 kg)  BMI 21.76 kg/m2  SpO2 99% General appearance: alert, cooperative and appears stated age Head: Normocephalic, without obvious abnormality, atraumatic Eyes: conj clear EOM, PEERLA Ears: normal TM's and external ear canals both ears Nose: Nares normal. Septum midline. Mucosa normal. No drainage or sinus tenderness. Throat: lips, mucosa, and tongue normal; teeth and gums normal Neck: no adenopathy, no carotid bruit, no JVD, supple, symmetrical, trachea midline and thyroid not  enlarged, symmetric, no tenderness/mass/nodules Back: symmetric, no curvature. ROM normal. No CVA tenderness. Lungs: clear to auscultation bilaterally Breasts: Inspection negative, She has implants and tattoed nipple on the right.  Heart: regular rate and rhythm, S1, S2 normal, no murmur, click, rub or gallop Abdomen: soft, non-tender; bowel sounds normal; no masses,  no organomegaly Extremities: extremities normal, atraumatic, no cyanosis or edema Pulses: 2+ and symmetric Skin: Skin color, texture, turgor normal. No rashes or lesions Lymph nodes: Cervical, supraclavicular, and axillary nodes normal. Neurologic: Alert and oriented X 3, normal strength and tone. Normal symmetric reflexes. Normal coordination and gait    Assessment:    Healthy female exam.      Plan:     See After Visit Summary for Counseling Recommendations  Keep up a regular exercise program and make sure you are eating a healthy diet Try to eat 4 servings of dairy a day, or if you are lactose intolerant take a calcium with vitamin D daily.  Your vaccines are up to date.  Keep f/u appt for mammo and with oncology  She's also concerned about potential for osteoporosis. She has noticed has been a decrease in her height. She has a sister and mother who both have osteoporosis. She would like to be tested. She's been menopausal since she went through chemotherapy for her breast cancer 3-4 years ago. Will schedule DEXA scan. We'll  also check vitamin D level. We reviewed appropriate doses of calcium and vitamin D and also discussed that getting her calcium from her diet is the best option. Recommended 4 servings per day.

## 2012-07-13 NOTE — Patient Instructions (Addendum)
Keep up a regular exercise program and make sure you are eating a healthy diet Try to eat 4 servings of dairy a day, or if you are lactose intolerant take a calcium with vitamin D daily.  Your vaccines are up to date.   

## 2012-07-22 ENCOUNTER — Other Ambulatory Visit: Payer: Self-pay | Admitting: Family Medicine

## 2012-07-22 DIAGNOSIS — Z1231 Encounter for screening mammogram for malignant neoplasm of breast: Secondary | ICD-10-CM

## 2012-08-10 ENCOUNTER — Other Ambulatory Visit: Payer: Self-pay | Admitting: Physician Assistant

## 2012-08-30 ENCOUNTER — Ambulatory Visit
Admission: RE | Admit: 2012-08-30 | Discharge: 2012-08-30 | Disposition: A | Discharge status: Home / Self Care | Payer: 59 | Source: Ambulatory Visit | Attending: Family Medicine | Admitting: Family Medicine

## 2012-08-30 DIAGNOSIS — Z1231 Encounter for screening mammogram for malignant neoplasm of breast: Secondary | ICD-10-CM

## 2012-09-06 ENCOUNTER — Other Ambulatory Visit: Payer: Self-pay | Admitting: *Deleted

## 2012-09-06 DIAGNOSIS — C50911 Malignant neoplasm of unspecified site of right female breast: Secondary | ICD-10-CM

## 2012-09-07 ENCOUNTER — Other Ambulatory Visit (HOSPITAL_BASED_OUTPATIENT_CLINIC_OR_DEPARTMENT_OTHER): Payer: 59 | Admitting: Lab

## 2012-09-07 ENCOUNTER — Other Ambulatory Visit: Payer: Self-pay | Admitting: Oncology

## 2012-09-07 ENCOUNTER — Ambulatory Visit (HOSPITAL_BASED_OUTPATIENT_CLINIC_OR_DEPARTMENT_OTHER): Payer: 59 | Admitting: Oncology

## 2012-09-07 VITALS — BP 119/75 | HR 62 | Temp 98.1°F | Resp 20 | Ht 67.0 in | Wt 142.5 lb

## 2012-09-07 DIAGNOSIS — C50419 Malignant neoplasm of upper-outer quadrant of unspecified female breast: Secondary | ICD-10-CM

## 2012-09-07 DIAGNOSIS — C50911 Malignant neoplasm of unspecified site of right female breast: Secondary | ICD-10-CM

## 2012-09-07 DIAGNOSIS — Z170 Estrogen receptor positive status [ER+]: Secondary | ICD-10-CM

## 2012-09-07 LAB — CBC WITH DIFFERENTIAL/PLATELET
BASO%: 2.3 % — ABNORMAL HIGH (ref 0.0–2.0)
EOS%: 1 % (ref 0.0–7.0)
Eosinophils Absolute: 0 10*3/uL (ref 0.0–0.5)
LYMPH%: 34.3 % (ref 14.0–49.7)
MCH: 31.7 pg (ref 25.1–34.0)
MCHC: 33.4 g/dL (ref 31.5–36.0)
MCV: 94.8 fL (ref 79.5–101.0)
MONO%: 11 % (ref 0.0–14.0)
Platelets: 260 10*3/uL (ref 145–400)
RBC: 4.09 10*6/uL (ref 3.70–5.45)
RDW: 12.6 % (ref 11.2–14.5)

## 2012-09-07 LAB — COMPREHENSIVE METABOLIC PANEL (CC13)
ALT: 19 U/L (ref 0–55)
AST: 24 U/L (ref 5–34)
Albumin: 3.9 g/dL (ref 3.5–5.0)
Alkaline Phosphatase: 58 U/L (ref 40–150)
Glucose: 100 mg/dl — ABNORMAL HIGH (ref 70–99)
Potassium: 3.8 mEq/L (ref 3.5–5.1)
Sodium: 139 mEq/L (ref 136–145)
Total Bilirubin: 0.87 mg/dL (ref 0.20–1.20)
Total Protein: 7.1 g/dL (ref 6.4–8.3)

## 2012-09-07 NOTE — Progress Notes (Signed)
ID: Dana [name]   DOB: 12/05/1960  MR#: [id number]  WUJ#:[id number]  PCP: Nani Gasser, MD   HISTORY OF PRESENT ILLNESS: She felt a palpable lesion in the right breast towards the end of January and brought it to Dr. Shelah Lewandowsky attention.  Dr. Linford Arnold set her up at [facility] for diagnostic mammography and right breast ultrasound, which was performed 07/14/2008.  The mammogram showed a 10-mm irregular mass in the upper right breast and also a separate cluster of coarse calcifications.  By exam, Dr. Si Gaul was able to find a firm palpable mass in the 10 o'clock position, 8 cm from the nipple, which by ultrasound measured 9 mm, was hypoechoic and irregular.  There was also a 12-mm irregular bilobed mass at a separate position.  The 2 masses, however, were separated only by 6 mm.  Both masses were biopsied on the same day and this showed (PM10-75 and PM10-76 as well as [id number]) invasive ductal carcinoma, both being ER and PR negative, and both having a proliferation marker of 74% with HER-2 negative by CISH with a ratio of 1.25 and 1.48.    With this information, the patient was referred to Dr. Dwain Sarna and bilateral breast MRIs were obtained on 07/19/2008.  This showed 2 areas of abnormal enhancement in the right breast, corresponding to the prior areas noted.  They appeared confluent and measured 2.6-cm in maximum extent, but really there were 2 masses, 1 being 1.4 cm and the other one 1 cm.  With this information, the patient proceeded to right lumpectomy and sentinel lymph node sampling on 08/02/2008 and this showed (S10-855 and PM10-126) multifocal invasive ductal carcinoma, the 2 foci measuring 1.4 and 1.2 cm, in the setting of ductal carcinoma in situ.  Margins were positive, there was no lymphovascular invasion, grade was 3, and 2 out of 4 lymph nodes examined had isolated tumor cells only.  The prognostic panel was repeated and was again ER and PR negative.  The CISH was again negative  at 1.01.  The patient had 1 final surgery on 08/30/2008.  This was right modified radical mastectomy with no residual tumor in the breast, and no tumor identified in 12 additional axillary lymph nodes, so she had a total of 16 removed. Her subsequent history is as detailed below.  INTERVAL HISTORY: Dana [name] returns for routine followup of her breast cancer. The interval history is generally unremarkable. She continues to visit her parents in [place] about once a month, the main concern of course being her dad who has fairly severe Alzheimer's. She is now working about 8-10 hours a day. Family is otherwise fine and she is exercising regularly, mostly by walking.   REVIEW OF SYSTEMS: A detailed review of systems is otherwise entirely negative, and in particular she cannot identify any residual symptoms that might be related to her prior treatment.  PAST MEDICAL HISTORY: Past Medical History  Diagnosis Date  . Breast calcification, right 07/03/2011    PAST SURGICAL HISTORY: Past Surgical History  Procedure Laterality Date  . Mastectomy, radical  2010    right   . Breast reconstruction    . Breast lumpectomy      FAMILY HISTORY Family History  Problem Relation Age of Onset  . Esophageal cancer Mother   . Hypertension Father   . Coronary artery disease Father 79  . Alzheimer's disease Father   The patient's father is alive at age 78.  He has significant ALZ disease.The patient's mother is alive at age  80; she has a history of throat cancer.  The patient has 3 siblings in good health.  There is no history of breast or ovarian cancer in the family.  GYNECOLOGIC HISTORY: She is GX P2, first pregnancy to term age 43.  Stopped menstruating with her chemotherapy.   SOCIAL HISTORY: Dana [name] moved here from [place] because of Jeff's job.  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  Her 2 children are Christiane Ha, 19, and [place], [place].  The patient attends a non-denominational church.   ADVANCED DIRECTIVES: In place  HEALTH MAINTENANCE: History  Substance Use Topics  . Smoking status: Never Smoker   . Smokeless tobacco: Never Used  . Alcohol Use: 1.8 - 2.4 oz/week    3-4 Glasses of wine per week     Colonoscopy:  PAP:  Bone density:  Lipid panel:  No Known Allergies  Current Outpatient Prescriptions  Medication Sig Dispense Refill  . calcium carbonate (OS-CAL) 600 MG TABS Take 600 mg by mouth daily.      Marland Kitchen FLUoxetine (PROZAC) 10 MG capsule Take 2 capsules (20 mg total) by mouth daily.  180 capsule  1  . Multiple Vitamin (MULTIVITAMIN) tablet Take 1 tablet by mouth daily.       No current facility-administered medications for this visit.    OBJECTIVE: Middle-aged white woman who appears well Filed Vitals:   09/07/12 0947  BP: 119/75  Pulse: 62  Temp: 98.1 F (36.7 C)  Resp: 20     Body mass index is 22.31 kg/(m^2).    ECOG FS: 0  Sclerae unicteric Oropharynx clear No cervical or supraclavicular adenopathy Lungs no rales or rhonchi Heart regular rate and rhythm Abd soft, nontender, positive bowel sounds MSK no focal spinal tenderness, no peripheral edema Neuro: nonfocal, well oriented, positive affect Breasts: The right breast is status post mastectomy with implant reconstruction. There is no evidence of local recurrence. The right axilla is benign. The left breast is unremarkable   LAB RESULTS: Lab Results  Component Value Date   WBC 4.0 09/07/2012   NEUTROABS 2.1 09/07/2012   HGB 13.0 09/07/2012   HCT 38.8 09/07/2012   MCV 94.8 09/07/2012   PLT 260 09/07/2012      Chemistry      Component Value Date/Time   NA 139 07/13/2012 1012   K 4.2 07/13/2012 1012   CL 100 07/13/2012 1012   CO2 31 07/13/2012 1012   BUN 7  07/13/2012 1012   CREATININE 0.60 07/13/2012 1012   CREATININE 0.62 12/29/2011 0943      Component Value Date/Time   CALCIUM 9.5 07/13/2012 1012   ALKPHOS 53 07/13/2012 1012   AST 16 07/13/2012 1012   ALT 11 07/13/2012 1012   BILITOT 0.6 07/13/2012 1012       Lab Results  Component Value Date   LABCA2 5 12/29/2011    No components found with this basename: [id number]    No results found for this basename: INR,  in the last 168 hours  Urinalysis No results found for this basename: colorurine,  appearanceur,  labspec,  phurine,  glucoseu,  hgbur,  bilirubinur,  ketonesur,  proteinur,  urobilinogen,  nitrite,  leukocytesur    STUDIES: Mm Digital Screening Unilat L  08/30/2012  *  RADIOLOGY REPORT*  Clinical Data: Screening.  MAMMOGRAPHIC UNILATERAL LEFT DIGITAL SCREENING WITH CAD  Comparison:  Previous exams.  FINDINGS:  ACR Breast Density Category 4: The breast tissue is extremely dense.  No suspicious masses, architectural distortion, or calcifications are present.  Images were processed with CAD.  IMPRESSION: No mammographic evidence of malignancy.  A result letter of this screening mammogram will be mailed directly to the patient.  RECOMMENDATION: Screening mammogram in one year. (Code:SM-B-01Y)  BI-RADS CATEGORY 2:  Benign finding(s).   Original Report Authenticated By: Sherian Rein, M.D.     ASSESSMENT: 52 y.o. [facility] woman status post right modified radical mastectomy March 2010 for multifocal breast cancer, the larger of 2 lesions measuring 1.4 cm, grade 3, no lymph node involved, triple negative, with an elevated MIB-1.   (1) The patient was treated according to ECOG 5103 with 4 cycles of dose-dense doxorubicin /cyclophosphamide given with bevacizumab, followed by 12 weekly doses of Taxol also given with bevacizumab.  All this was completed in September 2010.   PLAN: She is doing terrific from a breast cancer point of view. Her study would like [place] to see her on an every 6 month basis  until we complete 5 years of followup, but she is having to be out of her own pocket and she really would prefer to see [place] once a year and not every 6 months. I will run this by the study nurse.  Tentatively I have made her is an appointment to return in 6 months, but if she does not show we will see her again a year from now.  Dana [name] C    09/07/2012

## 2012-09-11 ENCOUNTER — Telehealth: Payer: Self-pay | Admitting: Oncology

## 2012-09-11 NOTE — Telephone Encounter (Signed)
lmonvm advisng the pt of her April 2015 appts

## 2012-12-22 ENCOUNTER — Other Ambulatory Visit: Payer: Self-pay | Admitting: *Deleted

## 2012-12-22 MED ORDER — FLUOXETINE HCL 10 MG PO CAPS: 20.0000 mg | ORAL_CAPSULE | Freq: Every day | ORAL | Status: DC

## 2013-04-18 ENCOUNTER — Other Ambulatory Visit: Payer: Self-pay | Admitting: Family Medicine

## 2013-07-28 ENCOUNTER — Other Ambulatory Visit: Payer: Self-pay

## 2013-07-28 DIAGNOSIS — Z1231 Encounter for screening mammogram for malignant neoplasm of breast: Secondary | ICD-10-CM

## 2013-07-28 DIAGNOSIS — Z9011 Acquired absence of right breast and nipple: Secondary | ICD-10-CM

## 2013-08-31 ENCOUNTER — Telehealth: Payer: Self-pay | Admitting: *Deleted

## 2013-08-31 ENCOUNTER — Ambulatory Visit: Payer: 59

## 2013-08-31 NOTE — Telephone Encounter (Signed)
08/31/13 at 4:24pm - The research nurse called the pt to discuss the [id number] study. The pt was interested in reading more about the study.  The research nurse will mail the pt a copy of the informed consent.  The pt said that she is very willing to give some of her blood for research.  The research nurse will call the pt on 09/12/13 to answer any questions about the study and her participation.  The pt was thanked for her continued support of clinical trials.

## 2013-09-02 ENCOUNTER — Ambulatory Visit
Admission: RE | Admit: 2013-09-02 | Discharge: 2013-09-02 | Disposition: A | Discharge status: Home / Self Care | Payer: 59 | Source: Ambulatory Visit

## 2013-09-02 DIAGNOSIS — Z1231 Encounter for screening mammogram for malignant neoplasm of breast: Secondary | ICD-10-CM

## 2013-09-02 DIAGNOSIS — Z9011 Acquired absence of right breast and nipple: Secondary | ICD-10-CM

## 2013-09-08 ENCOUNTER — Telehealth: Payer: Self-pay | Admitting: Oncology

## 2013-09-08 NOTE — Telephone Encounter (Signed)
pt cld to resch appt on 4/2 to an ealrier appt/adv we had an appt for 9:00/pt understood

## 2013-09-12 ENCOUNTER — Other Ambulatory Visit: Payer: Self-pay | Admitting: *Deleted

## 2013-09-12 DIAGNOSIS — C50911 Malignant neoplasm of unspecified site of right female breast: Secondary | ICD-10-CM

## 2013-09-15 ENCOUNTER — Ambulatory Visit: Payer: 59 | Admitting: *Deleted

## 2013-09-15 ENCOUNTER — Other Ambulatory Visit: Payer: 59

## 2013-09-15 ENCOUNTER — Other Ambulatory Visit (HOSPITAL_BASED_OUTPATIENT_CLINIC_OR_DEPARTMENT_OTHER): Payer: 59

## 2013-09-15 ENCOUNTER — Encounter: Payer: Self-pay | Admitting: *Deleted

## 2013-09-15 DIAGNOSIS — C50911 Malignant neoplasm of unspecified site of right female breast: Secondary | ICD-10-CM

## 2013-09-15 DIAGNOSIS — C50419 Malignant neoplasm of upper-outer quadrant of unspecified female breast: Secondary | ICD-10-CM

## 2013-09-15 LAB — CBC WITH DIFFERENTIAL/PLATELET
BASO%: 1.4 % (ref 0.0–2.0)
Basophils Absolute: 0.1 10*3/uL (ref 0.0–0.1)
EOS ABS: 0 10*3/uL (ref 0.0–0.5)
EOS%: 0.9 % (ref 0.0–7.0)
HCT: 42.1 % (ref 34.8–46.6)
HGB: 13.8 g/dL (ref 11.6–15.9)
LYMPH%: 28 % (ref 14.0–49.7)
MCH: 31.7 pg (ref 25.1–34.0)
MCHC: 32.8 g/dL (ref 31.5–36.0)
MCV: 96.8 fL (ref 79.5–101.0)
MONO#: 0.3 10*3/uL (ref 0.1–0.9)
MONO%: 7.7 % (ref 0.0–14.0)
NEUT%: 62 % (ref 38.4–76.8)
NEUTROS ABS: 2.7 10*3/uL (ref 1.5–6.5)
Platelets: 277 10*3/uL (ref 145–400)
RBC: 4.35 10*6/uL (ref 3.70–5.45)
RDW: 12.1 % (ref 11.2–14.5)
WBC: 4.3 10*3/uL (ref 3.9–10.3)
lymph#: 1.2 10*3/uL (ref 0.9–3.3)

## 2013-09-15 LAB — COMPREHENSIVE METABOLIC PANEL (CC13)
ALBUMIN: 4.4 g/dL (ref 3.5–5.0)
ALK PHOS: 58 U/L (ref 40–150)
ALT: 18 U/L (ref 0–55)
ANION GAP: 11 meq/L (ref 3–11)
AST: 25 U/L (ref 5–34)
BUN: 15.2 mg/dL (ref 7.0–26.0)
CO2: 25 meq/L (ref 22–29)
Calcium: 9.3 mg/dL (ref 8.4–10.4)
Chloride: 102 mEq/L (ref 98–109)
Creatinine: 0.7 mg/dL (ref 0.6–1.1)
GLUCOSE: 106 mg/dL (ref 70–140)
POTASSIUM: 4.1 meq/L (ref 3.5–5.1)
Sodium: 138 mEq/L (ref 136–145)
TOTAL PROTEIN: 7.7 g/dL (ref 6.4–8.3)
Total Bilirubin: 0.82 mg/dL (ref 0.20–1.20)

## 2013-09-15 LAB — RESEARCH LABS

## 2013-09-22 ENCOUNTER — Ambulatory Visit (HOSPITAL_BASED_OUTPATIENT_CLINIC_OR_DEPARTMENT_OTHER): Payer: 59 | Admitting: Oncology

## 2013-09-22 VITALS — BP 129/80 | HR 61 | Temp 98.3°F | Resp 20 | Ht 67.0 in | Wt 147.0 lb

## 2013-09-22 DIAGNOSIS — Z853 Personal history of malignant neoplasm of breast: Secondary | ICD-10-CM

## 2013-09-22 DIAGNOSIS — C50911 Malignant neoplasm of unspecified site of right female breast: Secondary | ICD-10-CM

## 2013-09-22 NOTE — Progress Notes (Signed)
ID: Dana [name]   DOB: 1961/03/17  MR#: [id number]  FTD#:[id number]  PCP: Beatrice Lecher, MD   HISTORY OF PRESENT ILLNESS: She felt a palpable lesion in the right breast towards the end of January and brought it to Dr. Gardiner Ramus attention.  Dr. Madilyn Fireman set her up at [facility] for diagnostic mammography and right breast ultrasound, which was performed 07/14/2008.  The mammogram showed a 10-mm irregular mass in the upper right breast and also a separate cluster of coarse calcifications.  By exam, Dr. Melanee Spry was able to find a firm palpable mass in the 10 o'clock position, 8 cm from the nipple, which by ultrasound measured 9 mm, was hypoechoic and irregular.  There was also a 12-mm irregular bilobed mass at a separate position.  The 2 masses, however, were separated only by 6 mm.  Both masses were biopsied on the same day and this showed (PM10-75 and PM10-76 as well as [id number]) invasive ductal carcinoma, both being ER and PR negative, and both having a proliferation marker of 74% with HER-2 negative by CISH with a ratio of 1.25 and 1.48.    With this information, the patient was referred to Dr. Donne Hazel and bilateral breast MRIs were obtained on 07/19/2008.  This showed 2 areas of abnormal enhancement in the right breast, corresponding to the prior areas noted.  They appeared confluent and measured 2.6-cm in maximum extent, but really there were 2 masses, 1 being 1.4 cm and the other one 1 cm.  With this information, the patient proceeded to right lumpectomy and sentinel lymph node sampling on 08/02/2008 and this showed (S10-855 and PM10-126) multifocal invasive ductal carcinoma, the 2 foci measuring 1.4 and 1.2 cm, in the setting of ductal carcinoma in situ.  Margins were positive, there was no lymphovascular invasion, grade was 3, and 2 out of 4 lymph nodes examined had isolated tumor cells only.  The prognostic panel was repeated and was again ER and PR negative.  The CISH was again negative  at 1.01.  The patient had 1 final surgery on 08/30/2008.  This was right modified radical mastectomy with no residual tumor in the breast, and no tumor identified in 12 additional axillary lymph nodes, so she had a total of 16 removed. Her subsequent history is as detailed below.  INTERVAL HISTORY: Dana [name] returns for routine followup of her breast cancer. The interval history is unremarkable. She is about to start working full-time again. She continues to take walks 2 miles at a time of at least 3 days a week.  REVIEW OF SYSTEMS: She has mild insomnia problems. She's noted that instead of one bowel movement a day she will have now 2 or sometimes even 3. After the first one the next 2 are put a lot of gas and not much else". There has not been any frank diarrhea, melena, or bright red blood per rectum. She is not aware of any change in her diet. Aside from this a detailed review of systems today was noncontributory  PAST MEDICAL HISTORY: Past Medical History  Diagnosis Date  . Breast calcification, right 07/03/2011    PAST SURGICAL HISTORY: Past Surgical History  Procedure Laterality Date  . Mastectomy, radical  2010    right   . Breast reconstruction    . Breast lumpectomy      FAMILY HISTORY Family History  Problem Relation Age of Onset  . Esophageal cancer Mother   . Hypertension Father   . Coronary artery disease Father 80  .  Alzheimer's disease Father   The patient's father is alive at age 29.  He has significant ALZ disease.The patient's mother is alive at age 44; she has a history of throat cancer.  The patient has 3 siblings in good health.  There is no history of breast or ovarian cancer in the family.  GYNECOLOGIC HISTORY: She is GX P2, first pregnancy to term age 53.  Stopped menstruating with her chemotherapy.   SOCIAL HISTORY: [name] moved here from [place] because of Jeff's job.  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  Her 2 children are Dana [name], 19, and Dana [name], [place].  The patient attends a non-denominational church.   ADVANCED DIRECTIVES: In place  HEALTH MAINTENANCE: History  Substance Use Topics  . Smoking status: Never Smoker   . Smokeless tobacco: Never Used  . Alcohol Use: 1.8 - 2.4 oz/week    3-4 Glasses of wine per week     Colonoscopy:  PAP:  Bone density:  Lipid panel:  No Known Allergies  Current Outpatient Prescriptions  Medication Sig Dispense Refill  . calcium carbonate (OS-CAL) 600 MG TABS Take 600 mg by mouth daily.      Marland Kitchen FLUoxetine (PROZAC) 10 MG capsule Take 2 capsules by mouth  daily  180 capsule  0  . Multiple Vitamin (MULTIVITAMIN) tablet Take 1 tablet by mouth daily.       No current facility-administered medications for this visit.    OBJECTIVE: Middle-aged white woman who appears well Filed Vitals:   09/22/13 1024  BP: 129/80  Pulse: 61  Temp: 98.3 F (36.8 C)  Resp: 20     Body mass index is 23.02 kg/(m^2).    ECOG FS: 0  Sclerae unicteric, pupils round and reactive Oropharynx clear and moist No cervical or supraclavicular adenopathy Lungs no rales or rhonchi Heart regular rate and rhythm, no murmur appreciated Abd soft, nontender, positive bowel sounds MSK no focal spinal tenderness, no peripheral edema Neuro: nonfocal, well oriented, positive affect Breasts: The right breast is status post mastectomy with implant reconstruction. There is no evidence of local recurrence. The right axilla is benign. The left breast is unremarkable   LAB RESULTS: Lab Results  Component Value Date   WBC 4.3 09/15/2013   NEUTROABS 2.7 09/15/2013   HGB 13.8 09/15/2013   HCT 42.1 09/15/2013   MCV 96.8 09/15/2013   PLT 277 09/15/2013      Chemistry      Component Value Date/Time   NA 138  09/15/2013 0905   NA 139 07/13/2012 1012   K 4.1 09/15/2013 0905   K 4.2 07/13/2012 1012   CL 101 09/07/2012 0926   CL 100 07/13/2012 1012   CO2 25 09/15/2013 0905   CO2 31 07/13/2012 1012   BUN 15.2 09/15/2013 0905   BUN 7 07/13/2012 1012   CREATININE 0.7 09/15/2013 0905   CREATININE 0.60 07/13/2012 1012   CREATININE 0.62 12/29/2011 0943      Component Value Date/Time   CALCIUM 9.3 09/15/2013 0905   CALCIUM 9.5 07/13/2012 1012   ALKPHOS 58 09/15/2013 0905   ALKPHOS 53 07/13/2012 1012   AST 25 09/15/2013 0905   AST 16 07/13/2012 1012   ALT 18 09/15/2013 0905   ALT 11 07/13/2012  1012   BILITOT 0.82 09/15/2013 0905   BILITOT 0.6 07/13/2012 1012       Lab Results  Component Value Date   LABCA2 5 12/29/2011    No components found with this basename: [id number]    No results found for this basename: INR,  in the last 168 hours  Urinalysis No results found for this basename: colorurine,  appearanceur,  labspec,  phurine,  glucoseu,  hgbur,  bilirubinur,  ketonesur,  proteinur,  urobilinogen,  nitrite,  leukocytesur    STUDIES:  DIGITAL SCREENING UNILATERAL LEFT MAMMOGRAM WITH TOMO AND CAD  COMPARISON: Previous exam(s).  ACR Breast Density Category c: The breast tissue is heterogeneously  dense, which may obscure small masses.  FINDINGS:  There are no findings suspicious for malignancy. Images were  processed with CAD.  IMPRESSION:  No mammographic evidence of malignancy. A result letter of this  screening mammogram will be mailed directly to the patient.  RECOMMENDATION:  Screening mammogram in one year. (Code:SM-B-01Y)  BI-RADS CATEGORY 1: Negative.  Electronically Signed  By: Curlene Dolphin M.D.  On: 09/02/2013 13:26    ASSESSMENT: 53 y.o. [facility] woman status post right modified radical mastectomy March 2010 for multifocal breast cancer, the larger of 2 lesions measuring 1.4 cm, grade 3, no lymph node involved, triple negative, with an elevated MIB-1.   (1) The patient was treated according  to ECOG 5103 with 4 cycles of dose-dense doxorubicin /cyclophosphamide given with bevacizumab, followed by 12 weekly doses of Taxol also given with bevacizumab.  All this was completed in September 2010.   PLAN: Amarea is doing fine as far as her breast cancer is concerned. She understands triple negative breast cancers, if they're going to recur, then to do so orally. The period of greatest dangerous the first 2 years.  Accordingly I am comfortable releasing her from followup at this point. She will need yearly mammography and yearly physician breast exam. Of course I will be glad to see Maricsa at any point in the future if the need arises, but as of now no further routine appointments are being made for her here.  Virgie Dad Tiea Manninen    09/22/2013

## 2013-09-23 ENCOUNTER — Telehealth: Payer: Self-pay | Admitting: Oncology

## 2013-09-23 NOTE — Telephone Encounter (Signed)
Sent letter to Dr. Catherine Metheney office from Dr. Magrinat °

## 2013-09-26 NOTE — Addendum Note (Signed)
Addended by: Laureen Abrahams on: 09/26/2013 05:57 PM   Modules accepted: Orders

## 2013-11-14 ENCOUNTER — Other Ambulatory Visit: Payer: Self-pay | Admitting: Family Medicine

## 2014-03-17 ENCOUNTER — Encounter: Payer: Self-pay | Admitting: Family Medicine

## 2014-03-17 ENCOUNTER — Ambulatory Visit (INDEPENDENT_AMBULATORY_CARE_PROVIDER_SITE_OTHER): Payer: 59 | Admitting: Family Medicine

## 2014-03-17 VITALS — BP 105/68 | HR 66 | Temp 97.8°F | Ht 67.0 in | Wt 148.0 lb

## 2014-03-17 DIAGNOSIS — Z23 Encounter for immunization: Secondary | ICD-10-CM

## 2014-03-17 DIAGNOSIS — G5603 Carpal tunnel syndrome, bilateral upper limbs: Secondary | ICD-10-CM

## 2014-03-17 DIAGNOSIS — Z0000 Encounter for general adult medical examination without abnormal findings: Secondary | ICD-10-CM

## 2014-03-17 LAB — LIPID PANEL
CHOL/HDL RATIO: 3.1 ratio
CHOLESTEROL: 249 mg/dL — AB (ref 0–200)
HDL: 80 mg/dL (ref >39)
LDL Cholesterol: 158 mg/dL — ABNORMAL HIGH (ref 0–99)
Triglycerides: 55 mg/dL (ref <150)
VLDL: 11 mg/dL (ref 0–40)

## 2014-03-17 LAB — BASIC METABOLIC PANEL WITH GFR
BUN: 11 mg/dL (ref 6–23)
CO2: 28 mEq/L (ref 19–32)
Calcium: 9.5 mg/dL (ref 8.4–10.5)
Chloride: 102 mEq/L (ref 96–112)
Creat: 0.63 mg/dL (ref 0.50–1.10)
Glucose, Bld: 90 mg/dL (ref 70–99)
POTASSIUM: 4.9 meq/L (ref 3.5–5.3)
Sodium: 137 mEq/L (ref 135–145)

## 2014-03-17 MED ORDER — FLUOXETINE HCL 10 MG PO CAPS: 10.0000 mg | ORAL_CAPSULE | Freq: Every day | ORAL | Status: DC

## 2014-03-17 NOTE — Patient Instructions (Signed)
[place]   Keep up a regular exercise program and make sure you are eating a healthy diet Try to eat 4 servings of dairy a day, or if you are lactose intolerant take a calcium with vitamin D daily.  Your vaccines are up to date.

## 2014-03-17 NOTE — Progress Notes (Signed)
Subjective:     Dana [name] is a 53 y.o. female and is here for a comprehensive physical exam. The patient reports problems - bilat hand numbness. Seems ot happen at night, when walking her dog and occ when driving.  No injury. Denies a lot of repetitive movement of the wrist at her job [employer]. she denies any trauma or injury. No neck pain.  History   Social History  . Marital Status: Married    Spouse Name: Merry Proud    Number of Children: 2  . Years of Education: N/A   Occupational History  . works for [employer] History Main Topics  . Smoking status: Never Smoker   . Smokeless tobacco: Never Used  . Alcohol Use: 1.8 - 2.4 oz/week    3-4 Glasses of wine per week  . Drug Use: No  . Sexual Activity: Yes    Partners: Male   Other Topics Concern  . Not on file   Social History Narrative   Some exercise. 2-3 caffeine drinks per day.    Health Maintenance  Topic Date Due  . Influenza Vaccine  01/14/2014  . Pap Smear  08/27/2014  . Mammogram  09/03/2015  . Colonoscopy  01/05/2019  . Tetanus/tdap  07/09/2021    The following portions of the patient's history were reviewed and updated as appropriate: allergies, current medications, past family history, past medical history, past social history, past surgical history and problem list.  Review of Systems A comprehensive review of systems was negative.   Objective:    BP 105/68  Pulse 66  Temp(Src) 97.8 F (36.6 C)  Ht 5\' 7"  (1.702 m)  Wt 148 lb (67.132 kg)  BMI 23.17 kg/m2 General appearance: alert, cooperative and appears stated age Head: Normocephalic, without obvious abnormality, atraumatic Eyes: conj clear EOMI, PEERLA Ears: normal TM's and external ear canals both ears Nose: Nares normal. Septum midline. Mucosa normal. No drainage or sinus tenderness. Throat: lips, mucosa, and tongue normal; teeth and gums normal Neck: no adenopathy, no carotid bruit, no JVD, supple, symmetrical, trachea midline  and thyroid not enlarged, symmetric, no tenderness/mass/nodules Back: symmetric, no curvature. ROM normal. No CVA tenderness. Lungs: clear to auscultation bilaterally Breasts: normal appearance, no masses or tenderness, implants bilateraly. Right breast has tatooted nipple. Scar is well healed.  Heart: regular rate and rhythm, S1, S2 normal, no murmur, click, rub or gallop Abdomen: soft, non-tender; bowel sounds normal; no masses,  no organomegaly Pelvic: not indicated; post-menopausal, no abnormal Pap smears in past Extremities: extremities normal, atraumatic, no cyanosis or edema. Neck wth NROM.  Neg tinnels and phalens sign.   Pulses: 2+ and symmetric Skin: Skin color, texture, turgor normal. No rashes or lesions Lymph nodes: Cervical, supraclavicular, and axillary nodes normal. Neurologic: Alert and oriented X 3, normal strength and tone. Normal symmetric reflexes. Normal coordination and gait    Assessment:    Healthy female exam.     Plan:     See After Visit Summary for Counseling Recommendations  Keep up a regular exercise program and make sure you are eating a healthy diet Try to eat 4 servings of dairy a day, or if you are lactose intolerant take a calcium with vitamin D daily.  Your vaccines are up to date. Flu vaccine given today.  Due for CMP, lipids, A1C.    Carpal tunnel-based on her description of her symptoms I suspect that she has carpal tunnel syndrome. We'll treat with bilateral cockup wrist  length and anti-inflammatory. Recommended Aleve one tab twice a day. Make sure take with food and water and stop immediately if any GI upset or irritation. After about 1-2 weeks of using anti-inflammatory she can start the rehabilitation exercises, with continuing to wear the splints at night. Followup in 3 weeks if not improving.

## 2014-03-18 LAB — HEMOGLOBIN A1C
Hgb A1c MFr Bld: 5.9 % — ABNORMAL HIGH (ref <5.7)
MEAN PLASMA GLUCOSE: 123 mg/dL — AB (ref <117)

## 2014-03-19 ENCOUNTER — Encounter: Payer: Self-pay | Admitting: Family Medicine

## 2014-03-19 DIAGNOSIS — R7301 Impaired fasting glucose: Secondary | ICD-10-CM | POA: Insufficient documentation

## 2014-07-21 ENCOUNTER — Other Ambulatory Visit: Payer: Self-pay | Admitting: Family Medicine

## 2014-08-16 ENCOUNTER — Other Ambulatory Visit: Payer: Self-pay

## 2014-08-16 DIAGNOSIS — Z1231 Encounter for screening mammogram for malignant neoplasm of breast: Secondary | ICD-10-CM

## 2014-08-16 DIAGNOSIS — Z9011 Acquired absence of right breast and nipple: Secondary | ICD-10-CM

## 2014-09-05 ENCOUNTER — Ambulatory Visit
Admission: RE | Admit: 2014-09-05 | Discharge: 2014-09-05 | Disposition: A | Discharge status: Home / Self Care | Payer: 59 | Source: Ambulatory Visit

## 2014-09-05 ENCOUNTER — Other Ambulatory Visit: Payer: Self-pay

## 2014-09-05 DIAGNOSIS — Z1231 Encounter for screening mammogram for malignant neoplasm of breast: Secondary | ICD-10-CM

## 2014-09-05 DIAGNOSIS — Z9011 Acquired absence of right breast and nipple: Secondary | ICD-10-CM

## 2014-09-20 ENCOUNTER — Ambulatory Visit: Payer: Self-pay | Admitting: Family Medicine

## 2014-10-02 ENCOUNTER — Ambulatory Visit (INDEPENDENT_AMBULATORY_CARE_PROVIDER_SITE_OTHER): Payer: 59 | Admitting: Family Medicine

## 2014-10-02 ENCOUNTER — Encounter: Payer: Self-pay | Admitting: Family Medicine

## 2014-10-02 VITALS — BP 120/75 | HR 54 | Ht 67.0 in | Wt 145.0 lb

## 2014-10-02 DIAGNOSIS — E7800 Pure hypercholesterolemia, unspecified: Secondary | ICD-10-CM

## 2014-10-02 DIAGNOSIS — R7301 Impaired fasting glucose: Secondary | ICD-10-CM

## 2014-10-02 DIAGNOSIS — F329 Major depressive disorder, single episode, unspecified: Secondary | ICD-10-CM | POA: Diagnosis not present

## 2014-10-02 DIAGNOSIS — F32A Depression, unspecified: Secondary | ICD-10-CM

## 2014-10-02 LAB — LDL CHOLESTEROL, DIRECT: Direct LDL: 128 mg/dL — ABNORMAL HIGH

## 2014-10-02 LAB — LIPID PANEL
CHOLESTEROL: 237 mg/dL — AB (ref 0–200)
HDL: 96 mg/dL (ref >=46)
LDL Cholesterol: 133 mg/dL — ABNORMAL HIGH (ref 0–99)
TRIGLYCERIDES: 42 mg/dL (ref <150)
Total CHOL/HDL Ratio: 2.5 Ratio
VLDL: 8 mg/dL (ref 0–40)

## 2014-10-02 LAB — POCT GLYCOSYLATED HEMOGLOBIN (HGB A1C): HEMOGLOBIN A1C: 5.5

## 2014-10-02 MED ORDER — FLUOXETINE HCL 10 MG PO TABS: 10.0000 mg | ORAL_TABLET | Freq: Every day | ORAL | Status: DC

## 2014-10-02 NOTE — Progress Notes (Signed)
   Subjective:    Patient ID: Dana [name], female    DOB: Aug 12, 1960, 54 y.o.   MRN: [id number]  HPI Follow-up depression-she's currently taking fluoxetine daily. She's tolerating it well without any side effects or problems.  Impaired fasting glucose-last 11 A1c was 5.9 in October.  She is walking 38miles a day and has dec her wine intake. Has lost 3 lbs. No inc thirst or urination.   Taking fiber and fish oil for he cholesterol.  Eating more fish and expericing.    Review of Systems     Objective:   Physical Exam  Constitutional: She is oriented to person, place, and time. She appears well-developed and well-nourished.  HENT:  Head: Normocephalic and atraumatic.  Cardiovascular: Normal rate, regular rhythm and normal heart sounds.   Pulmonary/Chest: Effort normal and breath sounds normal.  Neurological: She is alert and oriented to person, place, and time.  Skin: Skin is warm and dry.  Psychiatric: She has a normal mood and affect. Her behavior is normal.          Assessment & Plan:  Depression-PHQ 9 score of 1 today which is fantastic. When refill the medication for 6 months per her cough any palms or side effects. Refill the fluoxetine for 6 months.  Hypercholesterolemia - she has fantastic HDL. Reviewed her recent labs from 6 months ago with her. She's really tried to make some lifestyle changes. We'll recheck her cholesterol and direct LDL today.  IFG - A1c is back down to the normal range at 5.5 which is fantastic. Encouraged her to keep keep up the help he changes that she has made her lifestyle. And follow-up in 6 months.

## 2014-10-09 ENCOUNTER — Other Ambulatory Visit: Payer: Self-pay | Admitting: *Deleted

## 2014-10-09 DIAGNOSIS — C50911 Malignant neoplasm of unspecified site of right female breast: Secondary | ICD-10-CM

## 2014-10-12 ENCOUNTER — Other Ambulatory Visit: Payer: 59

## 2014-10-12 DIAGNOSIS — C50911 Malignant neoplasm of unspecified site of right female breast: Secondary | ICD-10-CM

## 2014-10-12 LAB — RESEARCH LABS

## 2014-10-20 ENCOUNTER — Encounter: Payer: Self-pay | Admitting: Oncology

## 2014-10-20 NOTE — Progress Notes (Signed)
Late entry from 10/12/14 - Patient came into the cancer center to have her blood drawn for the E5103/EL112LAB sub study. I had her weight done and it was 142 lbs.  She is not on any hormonal medications.  I thanked the patient for her continued support in this clinical trial. [place] Assistant

## 2015-04-13 ENCOUNTER — Other Ambulatory Visit: Payer: Self-pay | Admitting: Family Medicine

## 2015-09-05 ENCOUNTER — Other Ambulatory Visit: Payer: Self-pay

## 2015-09-05 DIAGNOSIS — Z1231 Encounter for screening mammogram for malignant neoplasm of breast: Secondary | ICD-10-CM

## 2015-09-26 ENCOUNTER — Other Ambulatory Visit: Payer: Self-pay | Admitting: Medical Oncology

## 2015-09-26 DIAGNOSIS — C50911 Malignant neoplasm of unspecified site of right female breast: Secondary | ICD-10-CM

## 2015-09-27 ENCOUNTER — Other Ambulatory Visit: Payer: Self-pay

## 2015-09-27 ENCOUNTER — Other Ambulatory Visit: Payer: 59

## 2015-09-27 ENCOUNTER — Encounter: Payer: Self-pay | Admitting: Oncology

## 2015-09-27 ENCOUNTER — Ambulatory Visit
Admission: RE | Admit: 2015-09-27 | Discharge: 2015-09-27 | Disposition: A | Discharge status: Home / Self Care | Payer: 59 | Source: Ambulatory Visit

## 2015-09-27 DIAGNOSIS — Z1231 Encounter for screening mammogram for malignant neoplasm of breast: Secondary | ICD-10-CM

## 2015-09-27 DIAGNOSIS — C50911 Malignant neoplasm of unspecified site of right female breast: Secondary | ICD-10-CM

## 2015-09-27 NOTE — Progress Notes (Signed)
09/27/15 - [facility] E5103/EL112LAB study - Patient into the cancer center to have her research labs drawn for the research study.  Patient states she is doing good.  No problems.  Her weight is 139 lbs and not taking any hormonal medicine. Remer Macho 09/27/15 - 1:40 pm

## 2015-11-14 ENCOUNTER — Other Ambulatory Visit: Payer: Self-pay | Admitting: Family Medicine

## 2016-01-01 ENCOUNTER — Other Ambulatory Visit (HOSPITAL_COMMUNITY)
Admission: RE | Admit: 2016-01-01 | Discharge: 2016-01-01 | Disposition: A | Discharge status: Home / Self Care | Payer: 59 | Source: Ambulatory Visit | Attending: Family Medicine | Admitting: Family Medicine

## 2016-01-01 ENCOUNTER — Encounter: Payer: Self-pay | Admitting: Family Medicine

## 2016-01-01 ENCOUNTER — Ambulatory Visit (INDEPENDENT_AMBULATORY_CARE_PROVIDER_SITE_OTHER): Payer: 59 | Admitting: Family Medicine

## 2016-01-01 VITALS — BP 125/78 | HR 59 | Wt 139.0 lb

## 2016-01-01 DIAGNOSIS — F329 Major depressive disorder, single episode, unspecified: Secondary | ICD-10-CM

## 2016-01-01 DIAGNOSIS — Z1159 Encounter for screening for other viral diseases: Secondary | ICD-10-CM

## 2016-01-01 DIAGNOSIS — Z114 Encounter for screening for human immunodeficiency virus [HIV]: Secondary | ICD-10-CM | POA: Diagnosis not present

## 2016-01-01 DIAGNOSIS — Z0000 Encounter for general adult medical examination without abnormal findings: Secondary | ICD-10-CM

## 2016-01-01 DIAGNOSIS — Z1151 Encounter for screening for human papillomavirus (HPV): Secondary | ICD-10-CM | POA: Insufficient documentation

## 2016-01-01 DIAGNOSIS — F32A Depression, unspecified: Secondary | ICD-10-CM

## 2016-01-01 DIAGNOSIS — Z01419 Encounter for gynecological examination (general) (routine) without abnormal findings: Secondary | ICD-10-CM | POA: Insufficient documentation

## 2016-01-01 LAB — LIPID PANEL
CHOL/HDL RATIO: 2.2 ratio (ref <=5.0)
CHOLESTEROL: 238 mg/dL — AB (ref 125–200)
HDL: 109 mg/dL (ref >=46)
LDL Cholesterol: 120 mg/dL (ref <130)
Triglycerides: 45 mg/dL (ref <150)
VLDL: 9 mg/dL (ref <30)

## 2016-01-01 LAB — COMPLETE METABOLIC PANEL WITH GFR
ALT: 15 U/L (ref 6–29)
AST: 21 U/L (ref 10–35)
Albumin: 4.5 g/dL (ref 3.6–5.1)
Alkaline Phosphatase: 45 U/L (ref 33–130)
BUN: 11 mg/dL (ref 7–25)
CALCIUM: 9.5 mg/dL (ref 8.6–10.4)
CHLORIDE: 100 mmol/L (ref 98–110)
CO2: 29 mmol/L (ref 20–31)
Creat: 0.73 mg/dL (ref 0.50–1.05)
GFR, Est African American: >89 mL/min (ref >=60)
GFR, Est Non African American: >89 mL/min (ref >=60)
GLUCOSE: 106 mg/dL — AB (ref 65–99)
POTASSIUM: 4.7 mmol/L (ref 3.5–5.3)
SODIUM: 138 mmol/L (ref 135–146)
Total Bilirubin: 0.5 mg/dL (ref 0.2–1.2)
Total Protein: 7.3 g/dL (ref 6.1–8.1)

## 2016-01-01 MED ORDER — FLUOXETINE HCL 10 MG PO CAPS: ORAL_CAPSULE | ORAL | Status: DC

## 2016-01-01 NOTE — Patient Instructions (Signed)
Keep up a regular exercise program and make sure you are eating a healthy diet Try to eat 4 servings of dairy a day, or if you are lactose intolerant take a calcium with vitamin D daily.  Your vaccines are up to date.   

## 2016-01-01 NOTE — Addendum Note (Signed)
Addended by: Teddy Spike on: 01/01/2016 11:31 AM   Modules accepted: Miquel Dunn

## 2016-01-01 NOTE — Progress Notes (Signed)
  Subjective:     Dana [name] is a 55 y.o. female and is here for a comprehensive physical exam. The patient reports no problems.  Follow-up depression-doing very well on current regimen of low-dose fluoxetine. She would like to continue the medication. She denies any significant symptoms.   Social History   Social History  . Marital Status: Married    Spouse Name: Merry Proud  . Number of Children: 2  . Years of Education: N/A   Occupational History  . works for [employer] History Main Topics  . Smoking status: Never Smoker   . Smokeless tobacco: Never Used  . Alcohol Use: 1.8 - 2.4 oz/week    3-4 Glasses of wine per week  . Drug Use: No  . Sexual Activity:    Partners: Male   Other Topics Concern  . Not on file   Social History Narrative   Some exercise. 2-3 caffeine drinks per day.    Health Maintenance  Topic Date Due  . Hepatitis C Screening  [birth year]  . HIV Screening  04/19/1975  . PAP SMEAR  07/09/2014  . INFLUENZA VACCINE  01/15/2016  . MAMMOGRAM  09/26/2017  . COLONOSCOPY  01/05/2019  . TETANUS/TDAP  07/09/2021    The following portions of the patient's history were reviewed and updated as appropriate: allergies, current medications, past family history, past medical history, past social history, past surgical history and problem list.  Review of Systems A comprehensive review of systems was negative.   Objective:    BP 125/78 mmHg  Pulse 59  Wt 139 lb (63.05 kg)  SpO2 98% General appearance: alert, cooperative and appears stated age Head: Normocephalic, without obvious abnormality, atraumatic Eyes: conj clear, EOMI, PEERLA Ears: normal TM's and external ear canals both ears Nose: Nares normal. Septum midline. Mucosa normal. No drainage or sinus tenderness. Throat: lips, mucosa, and tongue normal; teeth and gums normal Neck: no adenopathy, no carotid bruit, no JVD, supple, symmetrical, trachea midline and thyroid not enlarged, symmetric,  no tenderness/mass/nodules Back: symmetric, no curvature. ROM normal. No CVA tenderness. Lungs: clear to auscultation bilaterally Breasts: right breast normal with implant and tatooed nipple.  no nodules, induration, etc. Left breast normal. Heart: regular rate and rhythm, S1, S2 normal, no murmur, click, rub or gallop Abdomen: soft, non-tender; bowel sounds normal; no masses,  no organomegaly Pelvic: cervix normal in appearance, external genitalia normal, no adnexal masses or tenderness, no cervical motion tenderness, rectovaginal septum normal, uterus normal size, shape, and consistency and vagina normal without discharge Extremities: extremities normal, atraumatic, no cyanosis or edema Pulses: 2+ and symmetric Skin: Skin color, texture, turgor normal. No rashes or lesions Lymph nodes: Cervical, supraclavicular, and axillary nodes normal. Neurologic: Alert and oriented X 3, normal strength and tone. Normal symmetric reflexes. Normal coordination and gait    Assessment:    Healthy female exam.      Plan:     See After Visit Summary for Counseling Recommendations  Keep up a regular exercise program and make sure you are eating a healthy diet Try to eat 4 servings of dairy a day, or if you are lactose intolerant take a calcium with vitamin D daily.  Your vaccines are up to date.   Depression - refilled fluoxetine for 1 year.  PHQ- 9 score of 0. Doing well.

## 2016-01-02 LAB — HEMOGLOBIN A1C
Hgb A1c MFr Bld: 5.6 % (ref <5.7)
Mean Plasma Glucose: 114 mg/dL

## 2016-01-02 LAB — CYTOLOGY - PAP

## 2016-01-02 LAB — HIV ANTIBODY (ROUTINE TESTING W REFLEX): HIV 1&2 Ab, 4th Generation: NONREACTIVE

## 2016-01-02 LAB — HEPATITIS C ANTIBODY: HCV Ab: NEGATIVE

## 2016-09-08 ENCOUNTER — Other Ambulatory Visit: Payer: Self-pay | Admitting: Family Medicine

## 2016-09-08 DIAGNOSIS — Z1231 Encounter for screening mammogram for malignant neoplasm of breast: Secondary | ICD-10-CM

## 2016-09-23 ENCOUNTER — Other Ambulatory Visit: Payer: Self-pay | Admitting: *Deleted

## 2016-09-23 DIAGNOSIS — C50911 Malignant neoplasm of unspecified site of right female breast: Secondary | ICD-10-CM

## 2016-10-02 ENCOUNTER — Other Ambulatory Visit: Payer: 59

## 2016-10-02 ENCOUNTER — Ambulatory Visit
Admission: RE | Admit: 2016-10-02 | Discharge: 2016-10-02 | Disposition: A | Discharge status: Home / Self Care | Payer: 59 | Source: Ambulatory Visit | Attending: Family Medicine | Admitting: Family Medicine

## 2016-10-02 ENCOUNTER — Encounter: Payer: Self-pay | Admitting: Oncology

## 2016-10-02 DIAGNOSIS — Z1231 Encounter for screening mammogram for malignant neoplasm of breast: Secondary | ICD-10-CM

## 2016-10-02 DIAGNOSIS — C50911 Malignant neoplasm of unspecified site of right female breast: Secondary | ICD-10-CM

## 2016-10-02 LAB — RESEARCH LABS

## 2016-10-02 NOTE — Progress Notes (Signed)
10/02/16 - J7939 study - Patient into the cancer center to have her research labs drawn for the E5103/EL112LAB study.  This is year 3.  The patient's weight is 146 lbs. and she ate last at 7:00 pm on 10/01/16. Remer Macho 10/01/17 - 11:00 am

## 2017-01-05 ENCOUNTER — Other Ambulatory Visit: Payer: Self-pay | Admitting: Family Medicine

## 2017-01-05 DIAGNOSIS — Z0000 Encounter for general adult medical examination without abnormal findings: Secondary | ICD-10-CM

## 2017-01-13 ENCOUNTER — Telehealth: Payer: Self-pay | Admitting: Family Medicine

## 2017-01-13 ENCOUNTER — Other Ambulatory Visit: Payer: Self-pay

## 2017-01-13 DIAGNOSIS — Z0000 Encounter for general adult medical examination without abnormal findings: Secondary | ICD-10-CM

## 2017-01-13 MED ORDER — FLUOXETINE HCL 10 MG PO CAPS: 10.0000 mg | ORAL_CAPSULE | Freq: Every day | ORAL | 0 refills | Status: DC

## 2017-01-13 NOTE — Telephone Encounter (Signed)
Patient called scheduled an appt for Dr. Gardiner Ramus next available for a physical on 02/02/17 and needs a refill for fluoxetine 10mg s she is completely out. Adv pt will send a message.

## 2017-01-13 NOTE — Telephone Encounter (Signed)
30 day sent to [facility]

## 2017-02-02 ENCOUNTER — Encounter: Payer: Self-pay | Admitting: Family Medicine

## 2017-02-02 ENCOUNTER — Ambulatory Visit (INDEPENDENT_AMBULATORY_CARE_PROVIDER_SITE_OTHER): Payer: 59 | Admitting: Family Medicine

## 2017-02-02 ENCOUNTER — Telehealth: Payer: Self-pay | Admitting: Family Medicine

## 2017-02-02 DIAGNOSIS — Z0000 Encounter for general adult medical examination without abnormal findings: Secondary | ICD-10-CM

## 2017-02-02 DIAGNOSIS — R7309 Other abnormal glucose: Secondary | ICD-10-CM

## 2017-02-02 DIAGNOSIS — L989 Disorder of the skin and subcutaneous tissue, unspecified: Secondary | ICD-10-CM | POA: Diagnosis not present

## 2017-02-02 DIAGNOSIS — L570 Actinic keratosis: Secondary | ICD-10-CM | POA: Diagnosis not present

## 2017-02-02 MED ORDER — FLUOXETINE HCL 10 MG PO CAPS: 10.0000 mg | ORAL_CAPSULE | Freq: Every day | ORAL | 1 refills | Status: DC

## 2017-02-02 NOTE — Telephone Encounter (Signed)
Please call the lab and see if they can add a hemoglobin A1c to her blood work.

## 2017-02-02 NOTE — Progress Notes (Signed)
Subjective:     Dana [name] is a 56 y.o. female and is here for a comprehensive physical exam. The patient reports no problems.She did have a skin lesion on her right forearm that she wanted me to take a look at today. She noticed it maybe a couple months ago. She's not had any pain itching or irritation or bleeding from it. She does tan a lot. That she's had no prior history of skin cancer.  She does exercise regularly. And her youngest child just left for college. She feels like her mood has been well controlled.  Social History   Social History  . Marital status: Married    Spouse name: Merry Proud  . Number of children: 2  . Years of education: N/A   Occupational History  . works for [employer]   Social History Main Topics  . Smoking status: Never Smoker  . Smokeless tobacco: Never Used  . Alcohol use 1.8 - 2.4 oz/week    3 - 4 Glasses of wine per week  . Drug use: No  . Sexual activity: Yes    Partners: Male   Other Topics Concern  . Not on file   Social History Narrative   Some exercise. 2-3 caffeine drinks per day.    Health Maintenance  Topic Date Due  . INFLUENZA VACCINE  01/14/2017  . MAMMOGRAM  10/03/2018  . PAP SMEAR  01/01/2019  . COLONOSCOPY  01/05/2019  . TETANUS/TDAP  07/09/2021  . Hepatitis C Screening  Completed  . HIV Screening  Completed    The following portions of the patient's history were reviewed and updated as appropriate: allergies, current medications, past family history, past medical history, past social history, past surgical history and problem list.  Review of Systems  A comprehensive review of systems was negative.   Objective:    BP 130/83   Pulse 60   Wt 146 lb (66.2 kg)   SpO2 100%   BMI 22.87 kg/m  General appearance: alert, cooperative and appears stated age Head: Normocephalic, without obvious abnormality, atraumatic Eyes: conj clear, EOMI, PEERLA Ears: normal TM's and external ear canals both  ears Nose: Nares normal. Septum midline. Mucosa normal. No drainage or sinus tenderness. Throat: lips, mucosa, and tongue normal; teeth and gums normal Neck: no adenopathy, no carotid bruit, no JVD, supple, symmetrical, trachea midline and thyroid not enlarged, symmetric, no tenderness/mass/nodules Back: symmetric, no curvature. ROM normal. No CVA tenderness. Lungs: clear to auscultation bilaterally Breasts: bilat implants. no masses.  tattoed nipples in place Heart: regular rate and rhythm, S1, S2 normal, no murmur, click, rub or gallop Abdomen: soft, non-tender; bowel sounds normal; no masses,  no organomegaly Extremities: extremities normal, atraumatic, no cyanosis or edema Pulses: 2+ and symmetric Skin: Skin color, texture, turgor normal. No rashes or lesions Lymph nodes: Cervical, supraclavicular, and axillary nodes normal. Neurologic: Alert and oriented X 3, normal strength and tone. Normal symmetric reflexes. Normal coordination and gait    Assessment:    Healthy female exam.      Plan:     See After Visit Summary for Counseling Recommendations   Keep up a regular exercise program and make sure you are eating a healthy diet Try to eat 4 servings of dairy a day, or if you are lactose intolerant take a calcium with vitamin D daily.  Your vaccines are up to date.    Shave Biopsy Procedure Note  Pre-operative Diagnosis: Suspicious lesion for possible basal cell.  Post-operative Diagnosis: same  Locations:right posterior forearm.   Indications: new for a couple of months.   Anesthesia: Lidocaine 1% with epinephrine    Procedure Details  Patient informed of the risks (including bleeding and infection) and benefits of the  procedure and Verbal informed consent obtained.  The lesion and surrounding area were given a sterile prep using chlorhexidine and draped in the usual sterile fashion. A scalpel was used to shave an area of skin approximately 28mm by 84mm.  Hemostasis  achieved with alumuninum chloride. Antibiotic ointment and a sterile dressing applied.  The specimen was sent for pathologic examination. The patient tolerated the procedure well.  EBL: trace ml  Findings: Await pathology  Condition: Stable  Complications: none.  Plan: 1. Instructed to keep the wound dry and covered for 24-48h and clean thereafter. 2. Warning signs of infection were reviewed.   3. Recommended that the patient use OTC acetaminophen as needed for pain.  4. Return PRN.

## 2017-02-03 DIAGNOSIS — Z23 Encounter for immunization: Secondary | ICD-10-CM | POA: Diagnosis not present

## 2017-02-03 LAB — COMPLETE METABOLIC PANEL WITH GFR
ALT: 18 U/L (ref 6–29)
AST: 25 U/L (ref 10–35)
Albumin: 5 g/dL (ref 3.6–5.1)
Alkaline Phosphatase: 54 U/L (ref 33–130)
BILIRUBIN TOTAL: 0.6 mg/dL (ref 0.2–1.2)
BUN: 8 mg/dL (ref 7–25)
CO2: 22 mmol/L (ref 20–32)
Calcium: 9.5 mg/dL (ref 8.6–10.4)
Chloride: 100 mmol/L (ref 98–110)
Creat: 0.66 mg/dL (ref 0.50–1.05)
GFR, Est African American: >89 mL/min (ref >=60)
GFR, Est Non African American: >89 mL/min (ref >=60)
GLUCOSE: 103 mg/dL — AB (ref 65–99)
Potassium: 4.4 mmol/L (ref 3.5–5.3)
SODIUM: 140 mmol/L (ref 135–146)
Total Protein: 7.8 g/dL (ref 6.1–8.1)

## 2017-02-03 LAB — LIPID PANEL W/REFLEX DIRECT LDL
Cholesterol: 298 mg/dL — ABNORMAL HIGH (ref <200)
HDL: 128 mg/dL (ref >50)
LDL-CHOLESTEROL: 155 mg/dL — AB
NON-HDL CHOLESTEROL (CALC): 170 mg/dL — AB (ref <130)
TRIGLYCERIDES: 53 mg/dL (ref <150)
Total CHOL/HDL Ratio: 2.3 Ratio (ref <5.0)

## 2017-02-03 NOTE — Telephone Encounter (Signed)
Spoke with Pt, she would prefer to go to lab downstairs. Lab order placed. No further questions.

## 2017-02-03 NOTE — Telephone Encounter (Signed)
FYI

## 2017-02-03 NOTE — Telephone Encounter (Signed)
Called [facility], spoke with [place]. Not able to add A1c. Should Pt come in for NV?

## 2017-02-03 NOTE — Telephone Encounter (Signed)
Yes, see if would be willing to do so. Thank you

## 2017-02-05 LAB — HEMOGLOBIN A1C
HEMOGLOBIN A1C: 5.2 % (ref <5.7)
Mean Plasma Glucose: 103 mg/dL

## 2017-02-15 ENCOUNTER — Other Ambulatory Visit: Payer: Self-pay | Admitting: Family Medicine

## 2017-02-15 DIAGNOSIS — Z0000 Encounter for general adult medical examination without abnormal findings: Secondary | ICD-10-CM

## 2017-08-10 NOTE — Progress Notes (Signed)
This encounter was created in error - please disregard.

## 2017-08-28 ENCOUNTER — Other Ambulatory Visit: Payer: Self-pay | Admitting: Family Medicine

## 2017-08-28 DIAGNOSIS — Z0000 Encounter for general adult medical examination without abnormal findings: Secondary | ICD-10-CM

## 2017-08-31 ENCOUNTER — Other Ambulatory Visit: Payer: Self-pay | Admitting: Family Medicine

## 2017-08-31 DIAGNOSIS — Z1231 Encounter for screening mammogram for malignant neoplasm of breast: Secondary | ICD-10-CM

## 2017-10-14 ENCOUNTER — Ambulatory Visit
Admission: RE | Admit: 2017-10-14 | Discharge: 2017-10-14 | Disposition: A | Discharge status: Home / Self Care | Payer: 59 | Source: Ambulatory Visit | Attending: Family Medicine | Admitting: Family Medicine

## 2017-10-14 DIAGNOSIS — Z1231 Encounter for screening mammogram for malignant neoplasm of breast: Secondary | ICD-10-CM

## 2017-10-23 ENCOUNTER — Other Ambulatory Visit: Payer: Self-pay | Admitting: Family Medicine

## 2017-10-23 DIAGNOSIS — Z0000 Encounter for general adult medical examination without abnormal findings: Secondary | ICD-10-CM

## 2017-11-24 ENCOUNTER — Other Ambulatory Visit: Payer: Self-pay | Admitting: Family Medicine

## 2017-11-24 DIAGNOSIS — Z0000 Encounter for general adult medical examination without abnormal findings: Secondary | ICD-10-CM

## 2018-01-25 ENCOUNTER — Other Ambulatory Visit: Payer: Self-pay | Admitting: Oncology

## 2018-01-25 ENCOUNTER — Inpatient Hospital Stay: Payer: 59 | Attending: Oncology

## 2018-01-25 ENCOUNTER — Encounter: Payer: Self-pay | Admitting: Oncology

## 2018-01-25 DIAGNOSIS — C50911 Malignant neoplasm of unspecified site of right female breast: Secondary | ICD-10-CM

## 2018-01-25 LAB — RESEARCH LABS

## 2018-01-25 NOTE — Progress Notes (Signed)
01/25/18 - Patient called and came into the cancer center to have her research labs drawn for the E5103\EL112LAB study.  This is year 4.  The patient last ate at 9:00 am this morning and her weight was 140 lbs. I thanked the patient for her continued participation in this clinical trial. Remer Macho 01/25/18

## 2018-02-26 ENCOUNTER — Other Ambulatory Visit: Payer: Self-pay | Admitting: Family Medicine

## 2018-02-26 DIAGNOSIS — Z0000 Encounter for general adult medical examination without abnormal findings: Secondary | ICD-10-CM

## 2018-03-03 ENCOUNTER — Ambulatory Visit (INDEPENDENT_AMBULATORY_CARE_PROVIDER_SITE_OTHER): Payer: 59 | Admitting: Family Medicine

## 2018-03-03 ENCOUNTER — Encounter: Payer: Self-pay | Admitting: Family Medicine

## 2018-03-03 VITALS — BP 129/73 | HR 62 | Ht 67.0 in | Wt 143.0 lb

## 2018-03-03 DIAGNOSIS — Z0000 Encounter for general adult medical examination without abnormal findings: Secondary | ICD-10-CM

## 2018-03-03 DIAGNOSIS — Z23 Encounter for immunization: Secondary | ICD-10-CM

## 2018-03-03 LAB — COMPLETE METABOLIC PANEL WITH GFR
AG Ratio: 2 (calc) (ref 1.0–2.5)
ALT: 20 U/L (ref 6–29)
AST: 24 U/L (ref 10–35)
Albumin: 4.9 g/dL (ref 3.6–5.1)
Alkaline phosphatase (APISO): 46 U/L (ref 33–130)
BUN: 10 mg/dL (ref 7–25)
CALCIUM: 9.8 mg/dL (ref 8.6–10.4)
CO2: 27 mmol/L (ref 20–32)
CREATININE: 0.63 mg/dL (ref 0.50–1.05)
Chloride: 101 mmol/L (ref 98–110)
GFR, EST AFRICAN AMERICAN: 115 mL/min/{1.73_m2} (ref >=60)
GFR, EST NON AFRICAN AMERICAN: 100 mL/min/{1.73_m2} (ref >=60)
Globulin: 2.5 g/dL (calc) (ref 1.9–3.7)
Glucose, Bld: 108 mg/dL — ABNORMAL HIGH (ref 65–99)
POTASSIUM: 4.7 mmol/L (ref 3.5–5.3)
Sodium: 138 mmol/L (ref 135–146)
TOTAL PROTEIN: 7.4 g/dL (ref 6.1–8.1)
Total Bilirubin: 0.7 mg/dL (ref 0.2–1.2)

## 2018-03-03 LAB — LIPID PANEL
Cholesterol: 274 mg/dL — ABNORMAL HIGH (ref <200)
HDL: 111 mg/dL (ref >50)
LDL Cholesterol (Calc): 150 mg/dL (calc) — ABNORMAL HIGH
Non-HDL Cholesterol (Calc): 163 mg/dL (calc) — ABNORMAL HIGH (ref <130)
Total CHOL/HDL Ratio: 2.5 (calc) (ref <5.0)
Triglycerides: 43 mg/dL (ref <150)

## 2018-03-03 LAB — CBC
HEMATOCRIT: 41.2 % (ref 35.0–45.0)
Hemoglobin: 13.8 g/dL (ref 11.7–15.5)
MCH: 31.9 pg (ref 27.0–33.0)
MCHC: 33.5 g/dL (ref 32.0–36.0)
MCV: 95.2 fL (ref 80.0–100.0)
MPV: 10.6 fL (ref 7.5–12.5)
PLATELETS: 314 10*3/uL (ref 140–400)
RBC: 4.33 10*6/uL (ref 3.80–5.10)
RDW: 11.7 % (ref 11.0–15.0)
WBC: 3.9 10*3/uL (ref 3.8–10.8)

## 2018-03-03 NOTE — Addendum Note (Signed)
Addended by: Teddy Spike on: 03/03/2018 10:11 AM   Modules accepted: Orders

## 2018-03-03 NOTE — Progress Notes (Signed)
Subjective:     Dana [name] is a 57 y.o. female and is here for a comprehensive physical exam. The patient reports no problems. She has been gardening and some exercise.    Social History   Socioeconomic History  . Marital status: Married    Spouse name: Merry Proud  . Number of children: 2  . Years of education: Not on file  . Highest education level: Not on file  Occupational History  . Occupation: works for [employer]: [place]  . Financial resource strain: Not on file  . Food insecurity:    Worry: Not on file    Inability: Not on file  . Transportation needs:    Medical: Not on file    Non-medical: Not on file  Tobacco Use  . Smoking status: Never Smoker  . Smokeless tobacco: Never Used  Substance and Sexual Activity  . Alcohol use: Yes    Alcohol/week: 3.0 - 4.0 standard drinks    Types: 3 - 4 Glasses of wine per week  . Drug use: No  . Sexual activity: Yes    Partners: Male  Lifestyle  . Physical activity:    Days per week: Not on file    Minutes per session: Not on file  . Stress: Not on file  Relationships  . Social connections:    Talks on phone: Not on file    Gets together: Not on file    Attends religious service: Not on file    Active member of club or organization: Not on file    Attends meetings of clubs or organizations: Not on file    Relationship status: Not on file  . Intimate partner violence:    Fear of current or ex partner: Not on file    Emotionally abused: Not on file    Physically abused: Not on file    Forced sexual activity: Not on file  Other Topics Concern  . Not on file  Social History Narrative   Some exercise. 2-3 caffeine drinks per day.    Health Maintenance  Topic Date Due  . COLONOSCOPY  01/05/2019  . MAMMOGRAM  10/15/2019  . PAP SMEAR  12/31/2020  . TETANUS/TDAP  07/09/2021  . INFLUENZA VACCINE  Completed  . Hepatitis C Screening  Completed  . HIV Screening  Completed    The  following portions of the patient's history were reviewed and updated as appropriate: allergies, current medications, past family history, past medical history, past social history, past surgical history and problem list.  Review of Systems A comprehensive review of systems was negative.   Objective:    BP 129/73   Pulse 62   Ht 5\' 7"  (1.702 m)   Wt 143 lb (64.9 kg)   SpO2 100%   BMI 22.40 kg/m  General appearance: alert, cooperative and appears stated age Head: Normocephalic, without obvious abnormality, atraumatic Eyes: conj clear, EOMI, PEERLA Ears: normal TM's and external ear canals both ears Nose: Nares normal. Septum midline. Mucosa normal. No drainage or sinus tenderness. Throat: lips, mucosa, and tongue normal; teeth and gums normal Neck: no adenopathy, no carotid bruit, no JVD, supple, symmetrical, trachea midline and thyroid not enlarged, symmetric, no tenderness/mass/nodules Back: symmetric, no curvature. ROM normal. No CVA tenderness. Lungs: clear to auscultation bilaterally Breasts: left breast with normal exam, no mass or lesion. Right breast with well healed scar and tattoed nipple and implant Heart: regular rate and rhythm, S1, S2 normal,  no murmur, click, rub or gallop Abdomen: soft, non-tender; bowel sounds normal; no masses,  no organomegaly Extremities: extremities normal, atraumatic, no cyanosis or edema Pulses: 2+ and symmetric Skin: Skin color, texture, turgor normal. No rashes or lesions Lymph nodes: Cervical, supraclavicular, and axillary nodes normal. Neurologic: Alert and oriented X 3, normal strength and tone. Normal symmetric reflexes. Normal coordination and gait    Assessment:    Healthy female exam.      Plan:     See After Visit Summary for Counseling Recommendations   Keep up a regular exercise program and make sure you are eating a healthy diet Try to eat 4 servings of dairy a day, or if you are lactose intolerant take a calcium with  vitamin D daily.  Your vaccines are up to date.  Given flu and first shingles vaccine today.

## 2018-03-03 NOTE — Patient Instructions (Addendum)
Health Maintenance  Topic Date Due  . COLONOSCOPY  01/05/2019  . MAMMOGRAM  10/15/2019  . PAP SMEAR  12/31/2020  . TETANUS/TDAP  07/09/2021  . INFLUENZA VACCINE  Completed  . Hepatitis C Screening  Completed  . HIV Screening  Completed     Health Maintenance, Female Adopting a healthy lifestyle and getting preventive care can go a long way to promote health and wellness. Talk with your health care provider about what schedule of regular examinations is right for you. This is a good chance for you to check in with your provider about disease prevention and staying healthy. In between checkups, there are plenty of things you can do on your own. Experts have done a lot of research about which lifestyle changes and preventive measures are most likely to keep you healthy. Ask your health care provider for more information. Weight and diet Eat a healthy diet  Be sure to include plenty of vegetables, fruits, low-fat dairy products, and lean protein.  Do not eat a lot of foods high in solid fats, added sugars, or salt.  Get regular exercise. This is one of the most important things you can do for your health. ? Most adults should exercise for at least 150 minutes each week. The exercise should increase your heart rate and make you sweat (moderate-intensity exercise). ? Most adults should also do strengthening exercises at least twice a week. This is in addition to the moderate-intensity exercise.  Maintain a healthy weight  Body mass index (BMI) is a measurement that can be used to identify possible weight problems. It estimates body fat based on height and weight. Your health care provider can help determine your BMI and help you achieve or maintain a healthy weight.  For females 108 years of age and older: ? A BMI below 18.5 is considered underweight. ? A BMI of 18.5 to 24.9 is normal. ? A BMI of 25 to 29.9 is considered overweight. ? A BMI of 30 and above is considered obese.  Watch  levels of cholesterol and blood lipids  You should start having your blood tested for lipids and cholesterol at 58 years of age, then have this test every 5 years.  You may need to have your cholesterol levels checked more often if: ? Your lipid or cholesterol levels are high. ? You are older than 58 years of age. ? You are at high risk for heart disease.  Cancer screening Lung Cancer  Lung cancer screening is recommended for adults 5-77 years old who are at high risk for lung cancer because of a history of smoking.  A yearly low-dose CT scan of the lungs is recommended for people who: ? Currently smoke. ? Have quit within the past 15 years. ? Have at least a 30-pack-year history of smoking. A pack year is smoking an average of one pack of cigarettes a day for 1 year.  Yearly screening should continue until it has been 15 years since you quit.  Yearly screening should stop if you develop a health problem that would prevent you from having lung cancer treatment.  Breast Cancer  Practice breast self-awareness. This means understanding how your breasts normally appear and feel.  It also means doing regular breast self-exams. Let your health care provider know about any changes, no matter how small.  If you are in your 20s or 30s, you should have a clinical breast exam (CBE) by a health care provider every 1-3 years as part of a  regular health exam.  If you are 40 or older, have a CBE every year. Also consider having a breast X-ray (mammogram) every year.  If you have a family history of breast cancer, talk to your health care provider about genetic screening.  If you are at high risk for breast cancer, talk to your health care provider about having an MRI and a mammogram every year.  Breast cancer gene (BRCA) assessment is recommended for women who have family members with BRCA-related cancers. BRCA-related cancers include: ? Breast. ? Ovarian. ? Tubal. ? Peritoneal  cancers.  Results of the assessment will determine the need for genetic counseling and BRCA1 and BRCA2 testing.  Cervical Cancer Your health care provider may recommend that you be screened regularly for cancer of the pelvic organs (ovaries, uterus, and vagina). This screening involves a pelvic examination, including checking for microscopic changes to the surface of your cervix (Pap test). You may be encouraged to have this screening done every 3 years, beginning at age 86.  For women ages 51-65, health care providers may recommend pelvic exams and Pap testing every 3 years, or they may recommend the Pap and pelvic exam, combined with testing for human papilloma virus (HPV), every 5 years. Some types of HPV increase your risk of cervical cancer. Testing for HPV may also be done on women of any age with unclear Pap test results.  Other health care providers may not recommend any screening for nonpregnant women who are considered low risk for pelvic cancer and who do not have symptoms. Ask your health care provider if a screening pelvic exam is right for you.  If you have had past treatment for cervical cancer or a condition that could lead to cancer, you need Pap tests and screening for cancer for at least 20 years after your treatment. If Pap tests have been discontinued, your risk factors (such as having a new sexual partner) need to be reassessed to determine if screening should resume. Some women have medical problems that increase the chance of getting cervical cancer. In these cases, your health care provider may recommend more frequent screening and Pap tests.  Colorectal Cancer  This type of cancer can be detected and often prevented.  Routine colorectal cancer screening usually begins at 58 years of age and continues through 58 years of age.  Your health care provider may recommend screening at an earlier age if you have risk factors for colon cancer.  Your health care provider may also  recommend using home test kits to check for hidden blood in the stool.  A small camera at the end of a tube can be used to examine your colon directly (sigmoidoscopy or colonoscopy). This is done to check for the earliest forms of colorectal cancer.  Routine screening usually begins at age 32.  Direct examination of the colon should be repeated every 5-10 years through 58 years of age. However, you may need to be screened more often if early forms of precancerous polyps or small growths are found.  Skin Cancer  Check your skin from head to toe regularly.  Tell your health care provider about any new moles or changes in moles, especially if there is a change in a mole's shape or color.  Also tell your health care provider if you have a mole that is larger than the size of a pencil eraser.  Always use sunscreen. Apply sunscreen liberally and repeatedly throughout the day.  Protect yourself by wearing long sleeves, pants,  a wide-brimmed hat, and sunglasses whenever you are outside.  Heart disease, diabetes, and high blood pressure  High blood pressure causes heart disease and increases the risk of stroke. High blood pressure is more likely to develop in: ? People who have blood pressure in the high end of the normal range (130-139/85-89 mm Hg). ? People who are overweight or obese. ? People who are African American.  If you are 28-66 years of age, have your blood pressure checked every 3-5 years. If you are 56 years of age or older, have your blood pressure checked every year. You should have your blood pressure measured twice-once when you are at a hospital or clinic, and once when you are not at a hospital or clinic. Record the average of the two measurements. To check your blood pressure when you are not at a hospital or clinic, you can use: ? An automated blood pressure machine at a pharmacy. ? A home blood pressure monitor.  If you are between 50 years and 73 years old, ask your  health care provider if you should take aspirin to prevent strokes.  Have regular diabetes screenings. This involves taking a blood sample to check your fasting blood sugar level. ? If you are at a normal weight and have a low risk for diabetes, have this test once every three years after 58 years of age. ? If you are overweight and have a high risk for diabetes, consider being tested at a younger age or more often. Preventing infection Hepatitis B  If you have a higher risk for hepatitis B, you should be screened for this virus. You are considered at high risk for hepatitis B if: ? You were born in a country where hepatitis B is common. Ask your health care provider which countries are considered high risk. ? Your parents were born in a high-risk country, and you have not been immunized against hepatitis B (hepatitis B vaccine). ? You have HIV or AIDS. ? You use needles to inject street drugs. ? You live with someone who has hepatitis B. ? You have had sex with someone who has hepatitis B. ? You get hemodialysis treatment. ? You take certain medicines for conditions, including cancer, organ transplantation, and autoimmune conditions.  Hepatitis C  Blood testing is recommended for: ? Everyone born from [birth year] through 1966. ? Anyone with known risk factors for hepatitis C.  Sexually transmitted infections (STIs)  You should be screened for sexually transmitted infections (STIs) including gonorrhea and chlamydia if: ? You are sexually active and are younger than 58 years of age. ? You are older than 58 years of age and your health care provider tells you that you are at risk for this type of infection. ? Your sexual activity has changed since you were last screened and you are at an increased risk for chlamydia or gonorrhea. Ask your health care provider if you are at risk.  If you do not have HIV, but are at risk, it may be recommended that you take a prescription medicine daily to  prevent HIV infection. This is called pre-exposure prophylaxis (PrEP). You are considered at risk if: ? You are sexually active and do not regularly use condoms or know the HIV status of your partner(s). ? You take drugs by injection. ? You are sexually active with a partner who has HIV.  Talk with your health care provider about whether you are at high risk of being infected with HIV. If you choose  to begin PrEP, you should first be tested for HIV. You should then be tested every 3 months for as long as you are taking PrEP. Pregnancy  If you are premenopausal and you may become pregnant, ask your health care provider about preconception counseling.  If you may become pregnant, take 400 to 800 micrograms (mcg) of folic acid every day.  If you want to prevent pregnancy, talk to your health care provider about birth control (contraception). Osteoporosis and menopause  Osteoporosis is a disease in which the bones lose minerals and strength with aging. This can result in serious bone fractures. Your risk for osteoporosis can be identified using a bone density scan.  If you are 76 years of age or older, or if you are at risk for osteoporosis and fractures, ask your health care provider if you should be screened.  Ask your health care provider whether you should take a calcium or vitamin D supplement to lower your risk for osteoporosis.  Menopause may have certain physical symptoms and risks.  Hormone replacement therapy may reduce some of these symptoms and risks. Talk to your health care provider about whether hormone replacement therapy is right for you. Follow these instructions at home:  Schedule regular health, dental, and eye exams.  Stay current with your immunizations.  Do not use any tobacco products including cigarettes, chewing tobacco, or electronic cigarettes.  If you are pregnant, do not drink alcohol.  If you are breastfeeding, limit how much and how often you drink  alcohol.  Limit alcohol intake to no more than 1 drink per day for nonpregnant women. One drink equals 12 ounces of beer, 5 ounces of wine, or 1 ounces of hard liquor.  Do not use street drugs.  Do not share needles.  Ask your health care provider for help if you need support or information about quitting drugs.  Tell your health care provider if you often feel depressed.  Tell your health care provider if you have ever been abused or do not feel safe at home. This information is not intended to replace advice given to you by your health care provider. Make sure you discuss any questions you have with your health care provider. Document Released: 12/16/2010 Document Revised: 11/08/2015 Document Reviewed: 03/06/2015 Elsevier Interactive Patient Education  [employer].

## 2018-05-17 ENCOUNTER — Ambulatory Visit (INDEPENDENT_AMBULATORY_CARE_PROVIDER_SITE_OTHER): Payer: 59 | Admitting: Family Medicine

## 2018-05-17 VITALS — BP 131/71 | HR 65 | Temp 97.6°F | Wt 144.0 lb

## 2018-05-17 DIAGNOSIS — Z23 Encounter for immunization: Secondary | ICD-10-CM | POA: Diagnosis not present

## 2018-05-17 NOTE — Progress Notes (Signed)
Pt in today for shingrix vaccine. This is 2 of 2 of the shingrix series. Vitals taken and no fever noted. Vaccine was given in l deltoid. Pt tolerated well with no immediate complications.

## 2018-05-17 NOTE — Progress Notes (Signed)
Agree with documentation as above.   Gracie Gupta, MD  

## 2018-09-08 ENCOUNTER — Other Ambulatory Visit: Payer: Self-pay | Admitting: Family Medicine

## 2018-09-08 DIAGNOSIS — Z0000 Encounter for general adult medical examination without abnormal findings: Secondary | ICD-10-CM

## 2018-10-02 ENCOUNTER — Other Ambulatory Visit: Payer: Self-pay | Admitting: Family Medicine

## 2018-10-02 DIAGNOSIS — Z0000 Encounter for general adult medical examination without abnormal findings: Secondary | ICD-10-CM

## 2018-10-27 DIAGNOSIS — Z853 Personal history of malignant neoplasm of breast: Secondary | ICD-10-CM | POA: Diagnosis not present

## 2018-10-27 DIAGNOSIS — Z7689 Persons encountering health services in other specified circumstances: Secondary | ICD-10-CM | POA: Diagnosis not present

## 2018-10-27 DIAGNOSIS — R2232 Localized swelling, mass and lump, left upper limb: Secondary | ICD-10-CM | POA: Diagnosis not present

## 2018-10-27 DIAGNOSIS — Z8659 Personal history of other mental and behavioral disorders: Secondary | ICD-10-CM | POA: Diagnosis not present

## 2018-10-29 DIAGNOSIS — R928 Other abnormal and inconclusive findings on diagnostic imaging of breast: Secondary | ICD-10-CM | POA: Diagnosis not present

## 2018-10-29 DIAGNOSIS — N6489 Other specified disorders of breast: Secondary | ICD-10-CM | POA: Diagnosis not present

## 2019-03-07 ENCOUNTER — Other Ambulatory Visit: Payer: Self-pay | Admitting: Family Medicine

## 2019-03-07 DIAGNOSIS — F339 Major depressive disorder, recurrent, unspecified: Secondary | ICD-10-CM

## 2019-03-07 DIAGNOSIS — Z0000 Encounter for general adult medical examination without abnormal findings: Secondary | ICD-10-CM

## 2019-04-01 ENCOUNTER — Other Ambulatory Visit: Payer: Self-pay | Admitting: *Deleted

## 2019-04-01 MED ORDER — FLUOXETINE HCL 10 MG PO CAPS: 10.0000 mg | ORAL_CAPSULE | Freq: Every day | ORAL | 3 refills | Status: DC

## 2019-04-04 ENCOUNTER — Encounter: Payer: Self-pay | Admitting: Family Medicine

## 2019-04-04 ENCOUNTER — Other Ambulatory Visit: Payer: Self-pay

## 2019-04-04 ENCOUNTER — Telehealth: Payer: Self-pay | Admitting: Family Medicine

## 2019-04-04 ENCOUNTER — Ambulatory Visit (INDEPENDENT_AMBULATORY_CARE_PROVIDER_SITE_OTHER): Payer: BC Managed Care – PPO | Admitting: Family Medicine

## 2019-04-04 VITALS — BP 133/83 | Ht 67.0 in

## 2019-04-04 DIAGNOSIS — Z0000 Encounter for general adult medical examination without abnormal findings: Secondary | ICD-10-CM | POA: Diagnosis not present

## 2019-04-04 DIAGNOSIS — Z23 Encounter for immunization: Secondary | ICD-10-CM

## 2019-04-04 DIAGNOSIS — R7309 Other abnormal glucose: Secondary | ICD-10-CM | POA: Diagnosis not present

## 2019-04-04 DIAGNOSIS — Z853 Personal history of malignant neoplasm of breast: Secondary | ICD-10-CM

## 2019-04-04 NOTE — Telephone Encounter (Signed)
Left a message for a return call.

## 2019-04-04 NOTE — Progress Notes (Signed)
Subjective:     Dana [name] is a 58 y.o. female and is here for a comprehensive physical exam. The patient reports no problems.  She is actually doing really well overall.  She has been walking 3 days/week for exercise.  Her last Pap smear was in 2017.  Tetanus is up-to-date and she has had her shingles vaccine.  Mammogram was done at [facility].  We will abstract from care everywhere.  Her colonoscopy was done in 2015.  Social History   Socioeconomic History  . Marital status: Married    Spouse name: Merry Proud  . Number of children: 2  . Years of education: Not on file  . Highest education level: Not on file  Occupational History  . Occupation: works for [employer]: [place]  . Financial resource strain: Not on file  . Food insecurity    Worry: Not on file    Inability: Not on file  . Transportation needs    Medical: Not on file    Non-medical: Not on file  Tobacco Use  . Smoking status: Never Smoker  . Smokeless tobacco: Never Used  Substance and Sexual Activity  . Alcohol use: Yes    Alcohol/week: 3.0 - 4.0 standard drinks    Types: 3 - 4 Glasses of wine per week  . Drug use: No  . Sexual activity: Yes    Partners: Male  Lifestyle  . Physical activity    Days per week: Not on file    Minutes per session: Not on file  . Stress: Not on file  Relationships  . Social Herbalist on phone: Not on file    Gets together: Not on file    Attends religious service: Not on file    Active member of club or organization: Not on file    Attends meetings of clubs or organizations: Not on file    Relationship status: Not on file  . Intimate partner violence    Fear of current or ex partner: Not on file    Emotionally abused: Not on file    Physically abused: Not on file    Forced sexual activity: Not on file  Other Topics Concern  . Not on file  Social History Narrative   Some exercise. 2-3 caffeine drinks per day.    Health  Maintenance  Topic Date Due  . COLONOSCOPY  01/05/2019  . MAMMOGRAM  10/15/2019  . PAP SMEAR-Modifier  12/31/2020  . TETANUS/TDAP  07/09/2021  . INFLUENZA VACCINE  Completed  . Hepatitis C Screening  Completed  . HIV Screening  Completed    The following portions of the patient's history were reviewed and updated as appropriate: allergies, current medications, past family history, past medical history, past social history, past surgical history and problem list.  Review of Systems A comprehensive review of systems was negative.   Objective:    BP 133/83   Ht 5\' 7"  (1.702 m)   BMI 22.55 kg/m  General appearance: alert, cooperative and appears stated age Head: Normocephalic, without obvious abnormality, atraumatic Eyes: conj clear, EOMI, PEERLA Ears: normal TM's and external ear canals both ears Nose: Nares normal. Septum midline. Mucosa normal. No drainage or sinus tenderness. Throat: lips, mucosa, and tongue normal; teeth and gums normal Neck: no adenopathy, no carotid bruit, no JVD, supple, symmetrical, trachea midline and thyroid not enlarged, symmetric, no tenderness/mass/nodules Back: symmetric, no curvature. ROM normal. No CVA tenderness. Lungs:  clear to auscultation bilaterally Breasts: right breast the implant and tattoed nipple. left breast normal with no mass or lesion.   Heart: regular rate and rhythm, S1, S2 normal, no murmur, click, rub or gallop Abdomen: soft, non-tender; bowel sounds normal; no masses,  no organomegaly Extremities: extremities normal, atraumatic, no cyanosis or edema Pulses: 2+ and symmetric Skin: Skin color, texture, turgor normal. No rashes or lesions Lymph nodes: Cervical, supraclavicular, and axillary nodes normal. Neurologic: Alert and oriented X 3, normal strength and tone. Normal symmetric reflexes. Normal coordination and gait    Assessment:    Healthy female exam.     Plan:     See After Visit Summary for Counseling  Recommendations   Keep up a regular exercise program and make sure you are eating a healthy diet Try to eat 4 servings of dairy a day, or if you are lactose intolerant take a calcium with vitamin D daily.  Your vaccines are up to date.  Due for repeat colon cancer screening with [facility].  Last one was 5 years ago.

## 2019-04-04 NOTE — Assessment & Plan Note (Signed)
S/p mastectomy and implant

## 2019-04-04 NOTE — Telephone Encounter (Signed)
Patient: I did verify that her last colonoscopy was [facility] years ago at [facility].  They recommended that she return in 5 years because of the findings.  We will be happy to make a referral back to their office for repeat colonoscopy if she would like or if she has a preference for elsewhere we are happy to do so.

## 2019-04-05 LAB — CBC
HCT: 42.4 % (ref 35.0–45.0)
Hemoglobin: 14 g/dL (ref 11.7–15.5)
MCH: 31.3 pg (ref 27.0–33.0)
MCHC: 33 g/dL (ref 32.0–36.0)
MCV: 94.9 fL (ref 80.0–100.0)
MPV: 11 fL (ref 7.5–12.5)
Platelets: 297 10*3/uL (ref 140–400)
RBC: 4.47 10*6/uL (ref 3.80–5.10)
RDW: 11.2 % (ref 11.0–15.0)
WBC: 5.8 10*3/uL (ref 3.8–10.8)

## 2019-04-05 LAB — LIPID PANEL
Cholesterol: 278 mg/dL — ABNORMAL HIGH (ref <200)
HDL: 86 mg/dL (ref >=50)
LDL Cholesterol (Calc): 176 mg/dL (calc) — ABNORMAL HIGH
Non-HDL Cholesterol (Calc): 192 mg/dL (calc) — ABNORMAL HIGH (ref <130)
Total CHOL/HDL Ratio: 3.2 (calc) (ref <5.0)
Triglycerides: 64 mg/dL (ref <150)

## 2019-04-05 LAB — COMPLETE METABOLIC PANEL WITH GFR
AG Ratio: 1.7 (calc) (ref 1.0–2.5)
ALT: 12 U/L (ref 6–29)
AST: 20 U/L (ref 10–35)
Albumin: 4.5 g/dL (ref 3.6–5.1)
Alkaline phosphatase (APISO): 51 U/L (ref 37–153)
BUN: 14 mg/dL (ref 7–25)
CO2: 28 mmol/L (ref 20–32)
Calcium: 9.3 mg/dL (ref 8.6–10.4)
Chloride: 102 mmol/L (ref 98–110)
Creat: 0.64 mg/dL (ref 0.50–1.05)
GFR, Est African American: 114 mL/min/{1.73_m2} (ref >=60)
GFR, Est Non African American: 98 mL/min/{1.73_m2} (ref >=60)
Globulin: 2.6 g/dL (calc) (ref 1.9–3.7)
Glucose, Bld: 99 mg/dL (ref 65–99)
Potassium: 4.5 mmol/L (ref 3.5–5.3)
Sodium: 139 mmol/L (ref 135–146)
Total Bilirubin: 0.6 mg/dL (ref 0.2–1.2)
Total Protein: 7.1 g/dL (ref 6.1–8.1)

## 2019-04-05 LAB — HEMOGLOBIN A1C
Hgb A1c MFr Bld: 5.6 % of total Hgb (ref <5.7)
Mean Plasma Glucose: 114 (calc)
eAG (mmol/L): 6.3 (calc)

## 2019-04-06 NOTE — Telephone Encounter (Signed)
Patient's husband advised 

## 2019-07-04 ENCOUNTER — Ambulatory Visit (INDEPENDENT_AMBULATORY_CARE_PROVIDER_SITE_OTHER): Payer: BC Managed Care – PPO | Admitting: Nurse Practitioner

## 2019-07-04 ENCOUNTER — Other Ambulatory Visit: Payer: Self-pay

## 2019-07-04 ENCOUNTER — Encounter: Payer: Self-pay | Admitting: Nurse Practitioner

## 2019-07-04 ENCOUNTER — Ambulatory Visit (INDEPENDENT_AMBULATORY_CARE_PROVIDER_SITE_OTHER): Payer: BC Managed Care – PPO

## 2019-07-04 VITALS — BP 148/99 | HR 76 | Resp 16 | Ht 68.0 in | Wt 148.0 lb

## 2019-07-04 DIAGNOSIS — M545 Low back pain, unspecified: Secondary | ICD-10-CM

## 2019-07-04 DIAGNOSIS — M5126 Other intervertebral disc displacement, lumbar region: Secondary | ICD-10-CM | POA: Diagnosis not present

## 2019-07-04 MED ORDER — PREDNISONE 20 MG PO TABS: 20.0000 mg | ORAL_TABLET | Freq: Two times a day (BID) | ORAL | 0 refills | Status: AC

## 2019-07-04 NOTE — Progress Notes (Signed)
Acute Office Visit  Subjective:    Patient ID: Dana [name], female    DOB: 10-Feb-1961, 59 y.o.   MRN: [id number]  Chief Complaint  Patient presents with  . Back Pain    HPI Patient is in today for progressive lumbosacral back pain that has been going on for appx 3 weeks. The patient reports symptoms first started as bilateral aching in her lower back, which she usually gets when she comes down with a viral infection. She never became ill with viral symptoms, however, the back pain continued and progressed. On Saturday, 07/02/19, she recognized the pain was becoming worse on the right side of her back and her thighs were weak, aching, and "sore" bilaterally. She also realized that walking or putting pressure on the right leg increased the pain in her back. The pain and aching has progressively worsened.  At first she was able to change position and seek some relief from the pain, but she is now unable to sit or stand without discomfort. She does have some relief when she lays flat. She denies any known injury to the area. No heavy lifting, prolonged sitting, change in activities prior to pain onset. No history of injury or back issues.    At best the pain is 2/10 and at worst 6/10. It is now constant, aching, and sore.  She has a history of breast cancer approximately 8 years ago for which she had a right sided mastectomy and subsequent reconstructive surgery.   BACK PAIN Duration: weeks Mechanism of injury: no trauma Location: R>L and low back Onset: gradual Severity: 6/10 Quality: dull, aching and throbbing Frequency: intermittent generalized achiness when started 3 weeks ago. Now constant. Radiation: bilateral legs, above the knee, anterior and posterior achiness Aggravating factors: movement and weight bearing on the right leg Alleviating factors: advil, rest and laying Status: worse Treatments attempted: heat, ice, rest, ibuprofen, muscle relaxer Relief with NSAIDs?:  mild Nighttime pain:  yes- not resting well Paresthesias / decreased sensation:  no Bowel / bladder incontinence:  no Fevers:  no Dysuria / urinary frequency:  no  Past Medical History:  Diagnosis Date  . Breast calcification, right 07/03/2011    Past Surgical History:  Procedure Laterality Date  . BREAST LUMPECTOMY    . BREAST RECONSTRUCTION    . MASTECTOMY, RADICAL  2010   right     Family History  Problem Relation Age of Onset  . Esophageal cancer Mother   . Hypertension Father   . Coronary artery disease Father 2  . Alzheimer's disease Father     Social History   Socioeconomic History  . Marital status: Married    Spouse name: Merry Proud  . Number of children: 2  . Years of education: Not on file  . Highest education level: Not on file  Occupational History  . Occupation: works for [employer]: [place]  Tobacco Use  . Smoking status: Never Smoker  . Smokeless tobacco: Never Used  Substance and Sexual Activity  . Alcohol use: Yes    Alcohol/week: 3.0 - 4.0 standard drinks    Types: 3 - 4 Glasses of wine per week  . Drug use: No  . Sexual activity: Yes    Partners: Male  Other Topics Concern  . Not on file  Social History Narrative   Some exercise. 2-3 caffeine drinks per day.    Social Determinants of Health   Financial Resource Strain:   . Difficulty of Paying Living  Expenses: Not on file  Food Insecurity:   . Worried About Charity fundraiser in the Last Year: Not on file  . Ran Out of Food in the Last Year: Not on file  Transportation Needs:   . Lack of Transportation (Medical): Not on file  . Lack of Transportation (Non-Medical): Not on file  Physical Activity:   . Days of Exercise per Week: Not on file  . Minutes of Exercise per Session: Not on file  Stress:   . Feeling of Stress : Not on file  Social Connections:   . Frequency of Communication with Friends and Family: Not on file  . Frequency of Social Gatherings with Friends  and Family: Not on file  . Attends Religious Services: Not on file  . Active Member of Clubs or Organizations: Not on file  . Attends Archivist Meetings: Not on file  . Marital Status: Not on file  Intimate Partner Violence:   . Fear of Current or Ex-Partner: Not on file  . Emotionally Abused: Not on file  . Physically Abused: Not on file  . Sexually Abused: Not on file    Outpatient Medications Prior to Visit  Medication Sig Dispense Refill  . Calcium Carbonate-Vit D-Min (CALCIUM 1200 PO) Take 1 tablet by mouth daily.    Marland Kitchen FLUoxetine (PROZAC) 10 MG capsule Take 1 capsule (10 mg total) by mouth daily. Dx:f32.9 90 capsule 3  . Multiple Vitamins-Minerals (MULTIVITAMIN ADULT PO) Take 1 tablet by mouth daily.    . Omega-3 Fatty Acids (FISH OIL) 1000 MG CAPS Take 1 capsule by mouth daily.     No facility-administered medications prior to visit.    No Known Allergies  Review of Systems  Constitutional: Negative for appetite change, chills, fever and unexpected weight change.  Respiratory: Negative for chest tightness and shortness of breath.   Cardiovascular: Negative for chest pain and palpitations.  Gastrointestinal: Negative for abdominal distention, abdominal pain, constipation, diarrhea and nausea.  Genitourinary: Negative for difficulty urinating, dyspareunia, dysuria, flank pain, frequency, menstrual problem, pelvic pain and urgency.  Musculoskeletal: Positive for back pain, gait problem and myalgias. Negative for arthralgias and joint swelling.  Skin: Negative for pallor, rash and wound.  Neurological: Positive for weakness. Negative for dizziness, tremors and headaches.  Hematological: Does not bruise/bleed easily.  Psychiatric/Behavioral: Negative for sleep disturbance. The patient is not nervous/anxious.       Objective:    Physical Exam Musculoskeletal:     Cervical back: Normal. No swelling, deformity, rigidity or bony tenderness. No pain with movement.      Thoracic back: No swelling, edema, signs of trauma, tenderness or bony tenderness. Normal range of motion.     Lumbar back: No swelling, edema, deformity, signs of trauma, spasms, tenderness or bony tenderness. Decreased range of motion. Positive right straight leg raise test. Negative left straight leg raise test.     Right hip: No deformity, tenderness or bony tenderness. Decreased range of motion. Decreased strength.     Left hip: No deformity, tenderness or bony tenderness. Normal range of motion. Normal strength.     Right upper leg: No swelling, edema or tenderness.     Left upper leg: No swelling, edema or tenderness.     Right lower leg: No swelling or tenderness.     Left lower leg: No swelling or tenderness.     Comments: Positive straight leg test with Right leg.  Negative straight leg test with Left leg.  Neurological:  Mental Status: She is alert and oriented to person, place, and time.     Sensory: Sensation is intact.     Motor: Weakness present. No tremor, atrophy, abnormal muscle tone or pronator drift.     Gait: Gait abnormal.     Deep Tendon Reflexes: Reflexes are normal and symmetric.     BP (!) 148/99 (BP Location: Left Arm, Patient Position: Sitting, Cuff Size: Normal)   Pulse 76   Resp 16   Ht 5\' 8"  (1.727 m)   Wt 148 lb (67.1 kg)   BMI 22.50 kg/m  Wt Readings from Last 3 Encounters:  07/04/19 148 lb (67.1 kg)  05/17/18 144 lb (65.3 kg)  03/03/18 143 lb (64.9 kg)    Health Maintenance Due  Topic Date Due  . COLONOSCOPY  01/05/2019    There are no preventive care reminders to display for this patient.   Lab Results  Component Value Date   TSH 1.076 07/10/2011   Lab Results  Component Value Date   WBC 5.8 04/04/2019   HGB 14.0 04/04/2019   HCT 42.4 04/04/2019   MCV 94.9 04/04/2019   PLT 297 04/04/2019   Lab Results  Component Value Date   NA 139 04/04/2019   K 4.5 04/04/2019   CHLORIDE 102 09/15/2013   CO2 28 04/04/2019   GLUCOSE 99  04/04/2019   BUN 14 04/04/2019   CREATININE 0.64 04/04/2019   BILITOT 0.6 04/04/2019   ALKPHOS 54 02/02/2017   AST 20 04/04/2019   ALT 12 04/04/2019   PROT 7.1 04/04/2019   ALBUMIN 5.0 02/02/2017   CALCIUM 9.3 04/04/2019   ANIONGAP 11 09/15/2013   Lab Results  Component Value Date   CHOL 278 (H) 04/04/2019   Lab Results  Component Value Date   HDL 86 04/04/2019   Lab Results  Component Value Date   LDLCALC 176 (H) 04/04/2019   Lab Results  Component Value Date   TRIG 64 04/04/2019   Lab Results  Component Value Date   CHOLHDL 3.2 04/04/2019   Lab Results  Component Value Date   HGBA1C 5.6 04/04/2019       Assessment & Plan:   1. Acute bilateral low back pain without sciatica Lumbosacral pain radiating into anterior thighs indicating radiculopathy of the lumbar region. Muscle weakness R>L and progression of symptoms over 3 weeks without history of trauma or known injury. Positive personal history of breast cancer pertinent to need for diagnostic imaging, as opposed to watch and wait approach. X-ray and MRI of Lumbar spine today. Script for prednisone sent to reduce inflammation. Imagine results to determine where follow-up is most appropriate.   - predniSONE (DELTASONE) 20 MG tablet; Take 1 tablet (20 mg total) by mouth 2 (two) times daily with a meal for 5 days. Take 1 (one) pill with breakfast and 1 (one) pill with lunch.  Dispense: 10 tablet; Refill: 0 - MR Lumbar Spine Wo Contrast; Future - DG Lumbar Spine Complete; Future    Orma Render, NP

## 2019-07-04 NOTE — Patient Instructions (Addendum)
Xray and MRI today of the spine.    Acute Back Pain, Adult Acute back pain is sudden and usually short-lived. It is often caused by an injury to the muscles and tissues in the back. The injury may result from:  A muscle or ligament getting overstretched or torn (strained). Ligaments are tissues that connect bones to each other. Lifting something improperly can cause a back strain.  Wear and tear (degeneration) of the spinal disks. Spinal disks are circular tissue that provides cushioning between the bones of the spine (vertebrae).  Twisting motions, such as while playing sports or doing yard work.  A hit to the back.  Arthritis. You may have a physical exam, lab tests, and imaging tests to find the cause of your pain. Acute back pain usually goes away with rest and home care. Follow these instructions at home: Managing pain, stiffness, and swelling  Take over-the-counter and prescription medicines only as told by your health care provider.  Your health care provider may recommend applying ice during the first 24-48 hours after your pain starts. To do this: ? Put ice in a plastic bag. ? Place a towel between your skin and the bag. ? Leave the ice on for 20 minutes, 2-3 times a day.  If directed, apply heat to the affected area as often as told by your health care provider. Use the heat source that your health care provider recommends, such as a moist heat pack or a heating pad. ? Place a towel between your skin and the heat source. ? Leave the heat on for 20-30 minutes. ? Remove the heat if your skin turns bright red. This is especially important if you are unable to feel pain, heat, or cold. You have a greater risk of getting burned. Activity   Do not stay in bed. Staying in bed for more than 1-2 days can delay your recovery.  Sit up and stand up straight. Avoid leaning forward when you sit, or hunching over when you stand. ? If you work at a desk, sit close to it so you do not  need to lean over. Keep your chin tucked in. Keep your neck drawn back, and keep your elbows bent at a right angle. Your arms should look like the letter "L." ? Sit high and close to the steering wheel when you drive. Add lower back (lumbar) support to your car seat, if needed.  Take short walks on even surfaces as soon as you are able. Try to increase the length of time you walk each day.  Do not sit, drive, or stand in one place for more than 30 minutes at a time. Sitting or standing for long periods of time can put stress on your back.  Do not drive or use heavy machinery while taking prescription pain medicine.  Use proper lifting techniques. When you bend and lift, use positions that put less stress on your back: ? [place] your knees. ? Keep the load close to your body. ? Avoid twisting.  Exercise regularly as told by your health care provider. Exercising helps your back heal faster and helps prevent back injuries by keeping muscles strong and flexible.  Work with a physical therapist to make a safe exercise program, as recommended by your health care provider. Do any exercises as told by your physical therapist. Lifestyle  Maintain a healthy weight. Extra weight puts stress on your back and makes it difficult to have good posture.  Avoid activities or situations that make  you feel anxious or stressed. Stress and anxiety increase muscle tension and can make back pain worse. Learn ways to manage anxiety and stress, such as through exercise. General instructions  Sleep on a firm mattress in a comfortable position. Try lying on your side with your knees slightly bent. If you lie on your back, put a pillow under your knees.  Follow your treatment plan as told by your health care provider. This may include: ? Cognitive or behavioral therapy. ? Acupuncture or massage therapy. ? Meditation or yoga. Contact a health care provider if:  You have pain that is not relieved with rest or  medicine.  You have increasing pain going down into your legs or buttocks.  Your pain does not improve after 2 weeks.  You have pain at night.  You lose weight without trying.  You have a fever or chills. Get help right away if:  You develop new bowel or bladder control problems.  You have unusual weakness or numbness in your arms or legs.  You develop nausea or vomiting.  You develop abdominal pain.  You feel faint. Summary  Acute back pain is sudden and usually short-lived.  Use proper lifting techniques. When you bend and lift, use positions that put less stress on your back.  Take over-the-counter and prescription medicines and apply heat or ice as directed by your health care provider. This information is not intended to replace advice given to you by your health care provider. Make sure you discuss any questions you have with your health care provider. Document Revised: 09/21/2018 Document Reviewed: 01/14/2017 Elsevier Patient Education  [place].

## 2019-07-08 ENCOUNTER — Encounter: Payer: Self-pay | Admitting: Nurse Practitioner

## 2019-07-11 ENCOUNTER — Ambulatory Visit (INDEPENDENT_AMBULATORY_CARE_PROVIDER_SITE_OTHER): Payer: BC Managed Care – PPO | Admitting: Sports Medicine

## 2019-07-11 ENCOUNTER — Other Ambulatory Visit: Payer: Self-pay

## 2019-07-11 DIAGNOSIS — M51369 Other intervertebral disc degeneration, lumbar region without mention of lumbar back pain or lower extremity pain: Secondary | ICD-10-CM | POA: Insufficient documentation

## 2019-07-11 DIAGNOSIS — M5136 Other intervertebral disc degeneration, lumbar region: Secondary | ICD-10-CM | POA: Diagnosis not present

## 2019-07-11 MED ORDER — MELOXICAM 15 MG PO TABS: ORAL_TABLET | ORAL | 3 refills | Status: DC

## 2019-07-11 NOTE — Progress Notes (Signed)
    Procedures performed today:    None.  Independent interpretation of tests performed by another provider:   I personally reviewed her lumbar spine MRI, she has a moderate sized L2-L3 central disc protrusion.  There is only mild left L2-L3 foraminal stenosis, nothing on the right.  Impression and Recommendations:    Lumbar degenerative disc disease Dana [name] is a pleasant 59 year old female with axial discogenic back pain, MRI did show an L2-L3 disc protrusion, there was a bit of left-sided L2-L3 foraminal stenosis but pain is mostly on the right. We are going to start conservatively, she did improve significantly with prednisone, adding meloxicam, formal PT, we did discuss the developmental anthropology of back pain, I would like to see her back in 4 to 6 weeks, interventional epidural if no better.    ___________________________________________ Gwen Her. Dianah Field, M.D., ABFM., CAQSM. Primary Care and [place] Instructor of [place] [facility]

## 2019-07-11 NOTE — Assessment & Plan Note (Signed)
Dana [name] is a pleasant 59 year old female with axial discogenic back pain, MRI did show an L2-L3 disc protrusion, there was a bit of left-sided L2-L3 foraminal stenosis but pain is mostly on the right. We are going to start conservatively, she did improve significantly with prednisone, adding meloxicam, formal PT, we did discuss the developmental anthropology of back pain, I would like to see her back in 4 to 6 weeks, interventional epidural if no better.

## 2019-07-19 ENCOUNTER — Ambulatory Visit (INDEPENDENT_AMBULATORY_CARE_PROVIDER_SITE_OTHER): Payer: BC Managed Care – PPO | Admitting: Physical Therapy

## 2019-07-19 ENCOUNTER — Encounter: Payer: Self-pay | Admitting: Physical Therapy

## 2019-07-19 ENCOUNTER — Other Ambulatory Visit: Payer: Self-pay

## 2019-07-19 DIAGNOSIS — M545 Low back pain, unspecified: Secondary | ICD-10-CM

## 2019-07-19 NOTE — Therapy (Signed)
[place] [place], [place], [address] Phone: [phone]   Fax:  [phone]  Physical Therapy Evaluation  Patient Details  Name: Dana [name] MRN: [id number] Date of Birth: [birth year] Referring Provider (PT): Aundria Mems MD   Encounter Date: 07/19/2019  PT End of Session - 07/19/19 0801    Visit Number  1    Number of Visits  6    Date for PT Re-Evaluation  08/30/19    Authorization Type  BCBS    PT Start Time  0800    PT Stop Time  0846    PT Time Calculation (min)  46 min    Activity Tolerance  Patient tolerated treatment well    Behavior During Therapy  [facility] for tasks assessed/performed       Past Medical History:  Diagnosis Date  . Breast calcification, right 07/03/2011    Past Surgical History:  Procedure Laterality Date  . BREAST LUMPECTOMY    . BREAST RECONSTRUCTION    . MASTECTOMY, RADICAL  2010   right     There were no vitals filed for this visit.   Subjective Assessment - 07/19/19 0809    Subjective  Patient had low back discomfort for about 3 weeks in early January. She then began having pain around 07/03/19 to the point she couldn't do ADLS. Getting up in the morning was the worst or after sitting. She also couldn't flex her right hip without pain. Bil upper thighs were achy. She went that Monday and had imaging which showed disc protrusion at L2-3. Five days of steroids relieved sx considerably. She does not feel pain but can find the discomfort with certain movements.    Pertinent History  Breast CA    Diagnostic tests  xray    Patient Stated Goals  get rid of pain    Currently in Pain?  Yes    Pain Score  1     Pain Location  Back    Pain Orientation  Right;Lower    Pain Descriptors / Indicators  Discomfort;Aching;Sore    Pain Type  Acute pain    Pain Onset  1 to 4 weeks ago    Pain Frequency  Intermittent    Aggravating Factors   movement; prior to steroids  sitting, bending    Pain Relieving Factors  meds    Effect of Pain on Daily Activities  good posture         [facility] PT Assessment - 07/19/19 0001      Assessment   Medical Diagnosis  Lumbar DDD    Referring Provider (PT)  Aundria Mems MD    Onset Date/Surgical Date  07/03/19    Hand Dominance  Right    Next MD Visit  6 weeks      Precautions   Precautions  None      Restrictions   Weight Bearing Restrictions  No      Balance Screen   Has the patient fallen in the past 6 months  No    Has the patient had a decrease in activity level because of a fear of falling?   No    Is the patient reluctant to leave their home because of a fear of falling?   No      Home Film/video editor residence      Prior Function   Level of Independence  Independent    Vocation  Full time employment  Vocation Requirements  works at [employer] at [employer]  working out at [employer]  Postural limitations    Posture Comments  left lateral shift      ROM / Strength   AROM / PROM / Strength  AROM;Strength      AROM   Overall AROM Comments  Lumbar WNL slight decrease in RSB       Strength   Overall Strength Comments  weak core with MMT, right hip ext and flex 5-/5, ABD 4+/5, else 5/5      Flexibility   Soft Tissue Assessment /Muscle Length  yes    Hamstrings  left HS tightness    Quadriceps  left mild tightness    Piriformis  mild bil      Palpation   Spinal mobility  mild tightness with PA mobs; no pain noted     Palpation comment  mild tightness right lumbar paraspinals                 Objective measurements completed on examination: See above findings.              PT Education - 07/19/19 1450    Education Details  HEP, ADL modifications, lifting techniques    Person(s) Educated  Patient    Methods  Explanation;Demonstration;Handout    Comprehension   Verbalized understanding;Returned demonstration       PT Short Term Goals - 07/19/19 1451      PT SHORT TERM GOAL #1   Title  Ind with initial HEP    Time  1    Period  Days    Status  Achieved        PT Long Term Goals - 07/19/19 1451      PT LONG TERM GOAL #1   Title  Ind with advanced HEP, body mechanics and ADL modifications to prevent further injury    Time  6    Period  Weeks    Status  New    Target Date  08/30/19             Plan - 07/19/19 1053    Clinical Impression Statement  Patient presents today with c/o right LBP beginning in early January and worsening 07/01/19. Pain has been significantly reduced with meds but patient is still feeling intermittent pain with ADLS. She has full lumbar ROM, but demonstrates a slight left lateral shift. She has some flexibility deficits in BLE and mild strength deficits as well. Core is fairly strong with plank and side planks, but she has weakness with stabilization during MMT of hip flexors. Patient has a very high copay, so HEP and body mechanics were provided. She plans to utilize these and return to PT as needed. She would benefit from lumbar stabilization, ADL modifcations and lifting education to prevent further injury.    Personal Factors and Comorbidities  Comorbidity 1    Comorbidities  h/o breast CA    Stability/Clinical Decision Making  Stable/Uncomplicated    Clinical Decision Making  Low    Rehab Potential  Excellent    PT Frequency  1x / week   due to high copay   PT Duration  6 weeks    PT Treatment/Interventions  ADLs/Self Care Home Management;Therapeutic activities;Therapeutic exercise;Neuromuscular re-education;Manual techniques;Dry needling;Spinal Manipulations    PT Next Visit Plan  correct lateral shift prn, ADL modifications, lifting, lumbar stab (progress HEP)  Consulted and Agree with Plan of Care  Patient       Patient will benefit from skilled therapeutic intervention in order to improve the  following deficits and impairments:  Pain, Postural dysfunction, Decreased strength  Visit Diagnosis: Acute right-sided low back pain without sciatica - Plan: PT plan of care cert/re-cert     Problem List Patient Active Problem List   Diagnosis Date Noted  . Lumbar degenerative disc disease 07/11/2019  . IFG (impaired fasting glucose) 03/19/2014  . History of right breast cancer 07/03/2011  . DEPRESSION 07/10/2008    Madelyn Flavors PT 07/19/2019, 3:10 PM  [facility] [place], [place], [address] Phone: [phone]   Fax:  [phone]  Name: Dana [name] MRN: [id number] Date of Birth: 07/25/1960

## 2019-07-19 NOTE — Patient Instructions (Signed)
Access Code: [email or web address]  URL: [email or web address]  Date: 07/19/2019  Prepared by: Almyra Free Hayde Kilgour   Exercises Supine Hamstring Stretch with Strap - 3 reps - 1 sets - 60 sec hold - 2x daily - 7x weekly Left Standing Lateral Shift Correction at [place] - 3 reps - 1 sets - 30 sec hold - 2x daily - 7x weekly Supine Piriformis Stretch - 3 reps - 1 sets - 30-60 sec hold - 2x daily - 7x weekly Seated Piriformis Stretch with Trunk Bend - 3 reps - 1 sets - 30-60 sec hold - 2x daily - 7x weekly [place] Lumbar Extension Press Up - 10 reps - 1 sets - 1x daily - 7x weekly Patient Education Posture and Body Mechanics Lifting Techniques

## 2019-08-22 DIAGNOSIS — K621 Rectal polyp: Secondary | ICD-10-CM | POA: Diagnosis not present

## 2019-08-22 DIAGNOSIS — Z1211 Encounter for screening for malignant neoplasm of colon: Secondary | ICD-10-CM | POA: Diagnosis not present

## 2019-08-22 DIAGNOSIS — D128 Benign neoplasm of rectum: Secondary | ICD-10-CM | POA: Diagnosis not present

## 2019-08-22 DIAGNOSIS — Z86010 Personal history of colonic polyps: Secondary | ICD-10-CM | POA: Diagnosis not present

## 2019-08-22 LAB — HM COLONOSCOPY

## 2019-08-25 ENCOUNTER — Encounter: Payer: Self-pay | Admitting: Family Medicine

## 2019-12-09 ENCOUNTER — Emergency Department (HOSPITAL_BASED_OUTPATIENT_CLINIC_OR_DEPARTMENT_OTHER): Payer: BC Managed Care – PPO

## 2019-12-09 ENCOUNTER — Encounter (HOSPITAL_BASED_OUTPATIENT_CLINIC_OR_DEPARTMENT_OTHER): Payer: Self-pay | Admitting: *Deleted

## 2019-12-09 ENCOUNTER — Other Ambulatory Visit: Payer: Self-pay

## 2019-12-09 ENCOUNTER — Telehealth: Payer: Self-pay

## 2019-12-09 ENCOUNTER — Emergency Department (INDEPENDENT_AMBULATORY_CARE_PROVIDER_SITE_OTHER)
Admission: EM | Admit: 2019-12-09 | Discharge: 2019-12-09 | Disposition: A | Discharge status: Home / Self Care | Payer: BC Managed Care – PPO | Source: Home / Self Care | Attending: Family Medicine | Admitting: Family Medicine

## 2019-12-09 ENCOUNTER — Emergency Department (HOSPITAL_BASED_OUTPATIENT_CLINIC_OR_DEPARTMENT_OTHER)
Admission: EM | Admit: 2019-12-09 | Discharge: 2019-12-10 | Disposition: A | Discharge status: Home / Self Care | Payer: BC Managed Care – PPO | Source: Home / Self Care | Attending: Emergency Medicine | Admitting: Emergency Medicine

## 2019-12-09 DIAGNOSIS — R000 Tachycardia, unspecified: Secondary | ICD-10-CM | POA: Diagnosis not present

## 2019-12-09 DIAGNOSIS — R21 Rash and other nonspecific skin eruption: Secondary | ICD-10-CM | POA: Diagnosis not present

## 2019-12-09 DIAGNOSIS — K314 Gastric diverticulum: Secondary | ICD-10-CM | POA: Diagnosis not present

## 2019-12-09 DIAGNOSIS — N12 Tubulo-interstitial nephritis, not specified as acute or chronic: Secondary | ICD-10-CM | POA: Diagnosis not present

## 2019-12-09 DIAGNOSIS — N10 Acute tubulo-interstitial nephritis: Secondary | ICD-10-CM

## 2019-12-09 DIAGNOSIS — R509 Fever, unspecified: Secondary | ICD-10-CM | POA: Diagnosis not present

## 2019-12-09 DIAGNOSIS — R6520 Severe sepsis without septic shock: Secondary | ICD-10-CM | POA: Insufficient documentation

## 2019-12-09 DIAGNOSIS — A419 Sepsis, unspecified organism: Secondary | ICD-10-CM | POA: Insufficient documentation

## 2019-12-09 DIAGNOSIS — Z853 Personal history of malignant neoplasm of breast: Secondary | ICD-10-CM | POA: Insufficient documentation

## 2019-12-09 DIAGNOSIS — R300 Dysuria: Secondary | ICD-10-CM

## 2019-12-09 DIAGNOSIS — K5790 Diverticulosis of intestine, part unspecified, without perforation or abscess without bleeding: Secondary | ICD-10-CM | POA: Diagnosis not present

## 2019-12-09 DIAGNOSIS — Z20822 Contact with and (suspected) exposure to covid-19: Secondary | ICD-10-CM | POA: Insufficient documentation

## 2019-12-09 DIAGNOSIS — N1330 Unspecified hydronephrosis: Secondary | ICD-10-CM | POA: Diagnosis not present

## 2019-12-09 DIAGNOSIS — N281 Cyst of kidney, acquired: Secondary | ICD-10-CM | POA: Diagnosis not present

## 2019-12-09 LAB — CBC WITH DIFFERENTIAL/PLATELET
Abs Immature Granulocytes: 0.03 10*3/uL (ref 0.00–0.07)
Basophils Absolute: 0.1 10*3/uL (ref 0.0–0.1)
Basophils Relative: 1 %
Eosinophils Absolute: 0.2 10*3/uL (ref 0.0–0.5)
Eosinophils Relative: 2 %
HCT: 41 % (ref 36.0–46.0)
Hemoglobin: 13.4 g/dL (ref 12.0–15.0)
Immature Granulocytes: 0 %
Lymphocytes Relative: 7 %
Lymphs Abs: 0.7 10*3/uL (ref 0.7–4.0)
MCH: 31.2 pg (ref 26.0–34.0)
MCHC: 32.7 g/dL (ref 30.0–36.0)
MCV: 95.6 fL (ref 80.0–100.0)
Monocytes Absolute: 0.6 10*3/uL (ref 0.1–1.0)
Monocytes Relative: 6 %
Neutro Abs: 8.8 10*3/uL — ABNORMAL HIGH (ref 1.7–7.7)
Neutrophils Relative %: 84 %
Platelets: 283 10*3/uL (ref 150–400)
RBC: 4.29 MIL/uL (ref 3.87–5.11)
RDW: 12.1 % (ref 11.5–15.5)
WBC: 10.3 10*3/uL (ref 4.0–10.5)
nRBC: 0 % (ref 0.0–0.2)

## 2019-12-09 LAB — APTT: aPTT: 25 seconds (ref 24–36)

## 2019-12-09 LAB — COMPREHENSIVE METABOLIC PANEL
ALT: 15 U/L (ref 0–44)
AST: 18 U/L (ref 15–41)
Albumin: 4.1 g/dL (ref 3.5–5.0)
Alkaline Phosphatase: 52 U/L (ref 38–126)
Anion gap: 12 (ref 5–15)
BUN: 16 mg/dL (ref 6–20)
CO2: 25 mmol/L (ref 22–32)
Calcium: 8.8 mg/dL — ABNORMAL LOW (ref 8.9–10.3)
Chloride: 96 mmol/L — ABNORMAL LOW (ref 98–111)
Creatinine, Ser: 0.69 mg/dL (ref 0.44–1.00)
GFR calc Af Amer: >60 mL/min (ref >60)
GFR calc non Af Amer: >60 mL/min (ref >60)
Glucose, Bld: 153 mg/dL — ABNORMAL HIGH (ref 70–99)
Potassium: 3.7 mmol/L (ref 3.5–5.1)
Sodium: 133 mmol/L — ABNORMAL LOW (ref 135–145)
Total Bilirubin: 1 mg/dL (ref 0.3–1.2)
Total Protein: 7.6 g/dL (ref 6.5–8.1)

## 2019-12-09 LAB — POCT CBC W AUTO DIFF (K'VILLE URGENT CARE)

## 2019-12-09 LAB — POCT URINALYSIS DIP (MANUAL ENTRY)
Bilirubin, UA: NEGATIVE
Glucose, UA: NEGATIVE mg/dL
Ketones, POC UA: NEGATIVE mg/dL
Nitrite, UA: NEGATIVE
Protein Ur, POC: 30 mg/dL — AB
Spec Grav, UA: 1.015 (ref 1.010–1.025)
Urobilinogen, UA: 0.2 E.U./dL
pH, UA: 6.5 (ref 5.0–8.0)

## 2019-12-09 LAB — URINALYSIS, ROUTINE W REFLEX MICROSCOPIC
Bilirubin Urine: NEGATIVE
Glucose, UA: NEGATIVE mg/dL
Ketones, ur: NEGATIVE mg/dL
Nitrite: NEGATIVE
Protein, ur: 100 mg/dL — AB
Specific Gravity, Urine: 1.025 (ref 1.005–1.030)
pH: 6 (ref 5.0–8.0)

## 2019-12-09 LAB — LACTIC ACID, PLASMA: Lactic Acid, Venous: 1.4 mmol/L (ref 0.5–1.9)

## 2019-12-09 LAB — PROTIME-INR
INR: 1 (ref 0.8–1.2)
Prothrombin Time: 13 seconds (ref 11.4–15.2)

## 2019-12-09 LAB — URINALYSIS, MICROSCOPIC (REFLEX): WBC, UA: >50 WBC/hpf (ref 0–5)

## 2019-12-09 LAB — SARS CORONAVIRUS 2 BY RT PCR (HOSPITAL ORDER, PERFORMED IN ~~LOC~~ HOSPITAL LAB): SARS Coronavirus 2: NEGATIVE

## 2019-12-09 LAB — PREGNANCY, URINE: Preg Test, Ur: NEGATIVE

## 2019-12-09 MED ORDER — IOHEXOL 300 MG/ML  SOLN
100.0000 mL | Freq: Once | INTRAMUSCULAR | Status: AC | PRN
  Administered 2019-12-09: 100 mL via INTRAVENOUS

## 2019-12-09 MED ORDER — SODIUM CHLORIDE 0.9 % IV BOLUS
1000.0000 mL | Freq: Once | INTRAVENOUS | Status: AC
  Administered 2019-12-09: 1000 mL via INTRAVENOUS

## 2019-12-09 MED ORDER — SODIUM CHLORIDE 0.9 % IV SOLN
2.0000 g | Freq: Once | INTRAVENOUS | Status: AC
  Administered 2019-12-09: 2 g via INTRAVENOUS
  Filled 2019-12-09: qty 2

## 2019-12-09 MED ORDER — SODIUM CHLORIDE 0.9 % IV SOLN: 2.0000 g | Freq: Two times a day (BID) | INTRAVENOUS | Status: DC

## 2019-12-09 MED ORDER — CIPROFLOXACIN HCL 500 MG PO TABS
ORAL_TABLET | ORAL | 0 refills | Status: DC
Start: 2019-12-09 — End: 2019-12-20

## 2019-12-09 MED ORDER — ACETAMINOPHEN 325 MG PO TABS
650.0000 mg | ORAL_TABLET | Freq: Once | ORAL | Status: AC
  Administered 2019-12-09: 650 mg via ORAL
  Filled 2019-12-09: qty 2

## 2019-12-09 MED ORDER — SODIUM CHLORIDE 0.9% FLUSH
3.0000 mL | Freq: Once | INTRAVENOUS | Status: DC
  Filled 2019-12-09: qty 3

## 2019-12-09 NOTE — Discharge Instructions (Signed)
Increase fluid intake.  Monitor temperature.  May take Tylenol or ibuprofen as needed.  If symptoms become significantly worse during the night or over the weekend, proceed to the local emergency room.

## 2019-12-09 NOTE — Telephone Encounter (Signed)
Agree with documentation as above.   Charon Akamine, MD  

## 2019-12-09 NOTE — ED Triage Notes (Signed)
Pt c/o urine urgency and frequency since Monday. Dysuria started Wed. Some blood noted. Pt says 2 days ago started experiencing a fever and bodyaches. Aleve at 2pm.

## 2019-12-09 NOTE — Telephone Encounter (Signed)
Just an FYI Patients husband has called in office says his wife has been having UTI symptoms, she came home from work today and is now running a fever 101.2. Patient was advised to be seen at urgent care for an evaluation . Patients husband voiced his understanding.

## 2019-12-09 NOTE — ED Provider Notes (Signed)
[name]    CSN: [id number] Arrival date & time: 12/09/19  1522      History   Chief Complaint Chief Complaint  Patient presents with  . Dysuria    HPI Dana [name] is a 59 y.o. female.   Patient developed urinary frequency and urgency four days ago, followed by dysuria the next day. She has noticed occasional small amounts of blood in her urine.  During the past two days she has had chills/myalgias and fatigue.  Today she developed fever 100.2, increasing to 103.9.    Dysuria Pain quality:  Burning Pain severity:  Mild Onset quality:  Gradual Duration:  3 days Timing:  Constant Progression:  Worsening Chronicity:  New Recent urinary tract infections: no   Relieved by:  Nothing Worsened by:  Nothing Ineffective treatments:  NSAIDs Urinary symptoms: discolored urine, frequent urination, hematuria and hesitancy   Urinary symptoms: no foul-smelling urine and no bladder incontinence   Associated symptoms: fever and flank pain   Associated symptoms: no abdominal pain, no genital lesions, no nausea, no vaginal discharge and no vomiting   Risk factors: no recurrent urinary tract infections     Past Medical History:  Diagnosis Date  . Breast calcification, right 07/03/2011    Patient Active Problem List   Diagnosis Date Noted  . Lumbar degenerative disc disease 07/11/2019  . IFG (impaired fasting glucose) 03/19/2014  . History of right breast cancer 07/03/2011  . DEPRESSION 07/10/2008    Past Surgical History:  Procedure Laterality Date  . BREAST LUMPECTOMY    . BREAST RECONSTRUCTION    . MASTECTOMY, RADICAL  2010   right     OB History   No obstetric history on file.      Home Medications    Prior to Admission medications   Medication Sig Start Date End Date Taking? Authorizing Provider  Calcium Carbonate-Vit D-Min (CALCIUM 1200 PO) Take 1 tablet by mouth daily.    [provider]  ciprofloxacin (CIPRO) 500 MG tablet Take  one tab PO Q12hr for 7 days 12/09/19   Kandra Nicolas, MD  FLUoxetine (PROZAC) 10 MG capsule Take 1 capsule (10 mg total) by mouth daily. Dx:f32.9 04/01/19   Hali Marry, MD  meloxicam (MOBIC) 15 MG tablet One tab PO qAM with a meal for 2 weeks, then daily prn pain. 07/11/19   Silverio Decamp, MD  Multiple Vitamins-Minerals (MULTIVITAMIN ADULT PO) Take 1 tablet by mouth daily.    [provider]  Omega-3 Fatty Acids (FISH OIL) 1000 MG CAPS Take 1 capsule by mouth daily.    [provider]    Family History Family History  Problem Relation Age of Onset  . Esophageal cancer Mother   . Hypertension Father   . Coronary artery disease Father 71  . Alzheimer's disease Father     Social History Social History   Tobacco Use  . Smoking status: Never Smoker  . Smokeless tobacco: Never Used  Substance Use Topics  . Alcohol use: Yes    Alcohol/week: 3.0 - 4.0 standard drinks    Types: 3 - 4 Glasses of wine per week  . Drug use: No     Allergies   Patient has no known allergies.   Review of Systems Review of Systems  Constitutional: Positive for fever.  Gastrointestinal: Negative for abdominal pain, nausea and vomiting.  Genitourinary: Positive for dysuria, flank pain, hematuria and urgency. Negative for difficulty urinating, pelvic pain and vaginal discharge.  Musculoskeletal: Positive for myalgias.  Neurological: Negative.      Physical Exam Triage Vital Signs ED Triage Vitals  Enc Vitals Group     BP 12/09/19 1534 124/81     Pulse Rate 12/09/19 1534 96     Resp 12/09/19 1534 18     Temp 12/09/19 1534 99 F (37.2 C)     Temp Source 12/09/19 1534 Oral     SpO2 12/09/19 1534 100 %     Weight --      Height --      Head Circumference --      Peak Flow --      Pain Score 12/09/19 1536 1     Pain Loc --      Pain Edu? --      Excl. in [place]? --    No data found.  Updated Vital Signs BP 124/81 (BP Location: Left Arm)   Pulse 96    Temp 99 F (37.2 C) (Oral)   Resp 18   SpO2 100%   Visual Acuity Right Eye Distance:   Left Eye Distance:   Bilateral Distance:    Right Eye Near:   Left Eye Near:    Bilateral Near:     Physical Exam Nursing notes and Vital Signs reviewed. Appearance:  Patient appears stated age, and in no acute distress.    Eyes:  Pupils are equal, round, and reactive to light and accomodation.  Extraocular movement is intact.  Conjunctivae are not inflamed   Pharynx:  Normal; moist mucous membranes  Neck:  Supple.  No adenopathy Lungs:  Clear to auscultation.  Breath sounds are equal.  Moving air well. Heart:  Regular rate and rhythm without murmurs, rubs, or gallops.  Abdomen:  Mild tenderness over bladder without masses or hepatosplenomegaly.  Bowel sounds are present.  Mild bilateral flank tenderness present.  Extremities:  No edema.  Skin:  No rash present.     UC Treatments / Results  Labs (all labs ordered are listed, but only abnormal results are displayed) Labs Reviewed  POCT URINALYSIS DIP (MANUAL ENTRY) - Abnormal; Notable for the following components:      Result Value   Clarity, UA cloudy (*)    Blood, UA moderate (*)    Protein Ur, POC =30 (*)    Leukocytes, UA Large (3+) (*)    All other components within normal limits  URINE CULTURE  POCT CBC W AUTO DIFF ([facility]):  WBC 13.5; LY 12.5; MO 5.0; GR 82.5; Hgb 12.8; Platelets 332     EKG   Radiology No results found.  Procedures Procedures (including critical care time)  Medications Ordered in UC Medications - No data to display  Initial Impression / Assessment and Plan / UC Course  I have reviewed the triage vital signs and the nursing notes.  Pertinent labs & imaging results that were available during my care of the patient were reviewed by me and considered in my medical decision making (see chart for details).     Urine culture pending. Note leukocytosis (WBC 13.5). Administered Rocephin 1gm  IM, then begin Cipro 500mg  Q12hr for one week. Followup with Family Doctor in 3 days   Final Clinical Impressions(s) / UC Diagnoses   Final diagnoses:  Dysuria  Acute pyelonephritis     Discharge Instructions     Increase fluid intake.  Monitor temperature.  May take Tylenol or ibuprofen as needed.  If symptoms become significantly worse during the night or  over the weekend, proceed to the local emergency room.     ED Prescriptions    Medication Sig Dispense Auth. Provider   ciprofloxacin (CIPRO) 500 MG tablet Take one tab PO Q12hr for 7 days 14 tablet Kandra Nicolas, MD        Kandra Nicolas, MD 12/09/19 [phone]

## 2019-12-09 NOTE — ED Triage Notes (Signed)
C/o fever, chills, bodyaches, hematuria, urinary urgency, dysuria and frequency. She was seen earlier today at [facility] and told to come here.

## 2019-12-09 NOTE — ED Provider Notes (Signed)
[facility] Provider Note   CSN: [id number] Arrival date & time: 12/09/19  2103     History Chief Complaint  Patient presents with  . Urinary Tract Infection    Dana [name] is a 59 y.o. female with a past medical history significant for history of right breast cancer and depression who presents to the ED due to fever, body aches, hematuria, dysuria and back pain since Monday.  Patient states her symptoms started with urinary frequency on Monday and Tuesday and progressed to dysuria on Wednesday.  Patient admits to having intermittent fever since Wednesday associated with bilateral low back pain and body aches.  She also admits to bilateral groin discomfort.  Denies sick contacts and Covid exposures.  Patient was evaluated in urgent care earlier today and diagnosed with a urinary tract infection and given 1 g of Rocephin and prescribed ciprofloxacin which she is taken 1 dose of.  Patient reports to the ED tonight given worsening of symptoms.  Denies nausea, vomiting, and diarrhea.  No history of chronic UTIs.  Denies vaginal symptoms.  She has taken Aleve with no relief.  History obtained from patient and past medical records. No interpreter used during encounter.       Past Medical History:  Diagnosis Date  . Breast calcification, right 07/03/2011    Patient Active Problem List   Diagnosis Date Noted  . Lumbar degenerative disc disease 07/11/2019  . IFG (impaired fasting glucose) 03/19/2014  . History of right breast cancer 07/03/2011  . DEPRESSION 07/10/2008    Past Surgical History:  Procedure Laterality Date  . BREAST LUMPECTOMY    . BREAST RECONSTRUCTION    . MASTECTOMY, RADICAL  2010   right      OB History   No obstetric history on file.     Family History  Problem Relation Age of Onset  . Esophageal cancer Mother   . Hypertension Father   . Coronary artery disease Father 45  . Alzheimer's disease Father     Social  History   Tobacco Use  . Smoking status: Never Smoker  . Smokeless tobacco: Never Used  Substance Use Topics  . Alcohol use: Yes    Alcohol/week: 3.0 - 4.0 standard drinks    Types: 3 - 4 Glasses of wine per week  . Drug use: No    Home Medications Prior to Admission medications   Medication Sig Start Date End Date Taking? Authorizing Provider  Calcium Carbonate-Vit D-Min (CALCIUM 1200 PO) Take 1 tablet by mouth daily.   Yes [provider]  ciprofloxacin (CIPRO) 500 MG tablet Take one tab PO Q12hr for 7 days 12/09/19  Yes Kandra Nicolas, MD  FLUoxetine (PROZAC) 10 MG capsule Take 1 capsule (10 mg total) by mouth daily. Dx:f32.9 04/01/19  Yes Hali Marry, MD  meloxicam (MOBIC) 15 MG tablet One tab PO qAM with a meal for 2 weeks, then daily prn pain. 07/11/19  Yes Silverio Decamp, MD  Multiple Vitamins-Minerals (MULTIVITAMIN ADULT PO) Take 1 tablet by mouth daily.   Yes [provider]  Omega-3 Fatty Acids (FISH OIL) 1000 MG CAPS Take 1 capsule by mouth daily.   Yes [provider]    Allergies    Patient has no known allergies.  Review of Systems   Review of Systems  Constitutional: Positive for chills and fever.  Respiratory: Positive for shortness of breath.   Cardiovascular: Negative for chest pain.  Gastrointestinal: Positive for abdominal pain.  Negative for diarrhea, nausea and vomiting.  Genitourinary: Positive for dysuria and hematuria. Negative for vaginal discharge.  Musculoskeletal: Positive for back pain and myalgias.  All other systems reviewed and are negative.   Physical Exam Updated Vital Signs BP 121/78   Pulse 84   Temp (!) 103.3 F (39.6 C) (Oral)   Resp 18   Ht 5\' 8"  (1.727 m)   Wt 67.1 kg   SpO2 100%   BMI 22.49 kg/m   Physical Exam Vitals and nursing note reviewed.  Constitutional:      General: She is not in acute distress.    Appearance: She is ill-appearing and diaphoretic.  HENT:     Head:  Normocephalic.  Eyes:     Pupils: Pupils are equal, round, and reactive to light.  Cardiovascular:     Rate and Rhythm: Regular rhythm. Tachycardia present.     Pulses: Normal pulses.     Heart sounds: Normal heart sounds. No murmur heard.  No friction rub. No gallop.   Pulmonary:     Effort: Pulmonary effort is normal.     Breath sounds: Normal breath sounds.  Abdominal:     General: Abdomen is flat. Bowel sounds are normal. There is no distension.     Palpations: Abdomen is soft.     Tenderness: There is no abdominal tenderness. There is left CVA tenderness. There is no guarding or rebound.     Comments: Left CVA tenderness.  Musculoskeletal:     Cervical back: Neck supple.     Comments: No lower extremity edema.  Skin:    General: Skin is warm.  Neurological:     General: No focal deficit present.     Mental Status: She is alert.  Psychiatric:        Mood and Affect: Mood normal.        Behavior: Behavior normal.     ED Results / Procedures / Treatments   Labs (all labs ordered are listed, but only abnormal results are displayed) Labs Reviewed  COMPREHENSIVE METABOLIC PANEL - Abnormal; Notable for the following components:      Result Value   Sodium 133 (*)    Chloride 96 (*)    Glucose, Bld 153 (*)    Calcium 8.8 (*)    All other components within normal limits  CBC WITH DIFFERENTIAL/PLATELET - Abnormal; Notable for the following components:   Neutro Abs 8.8 (*)    All other components within normal limits  URINALYSIS, ROUTINE W REFLEX MICROSCOPIC - Abnormal; Notable for the following components:   APPearance CLOUDY (*)    Hgb urine dipstick MODERATE (*)    Protein, ur 100 (*)    Leukocytes,Ua LARGE (*)    All other components within normal limits  URINALYSIS, MICROSCOPIC (REFLEX) - Abnormal; Notable for the following components:   Bacteria, UA MANY (*)    All other components within normal limits  CULTURE, BLOOD (ROUTINE X 2)  CULTURE, BLOOD (ROUTINE X 2)    URINE CULTURE  SARS CORONAVIRUS 2 BY RT PCR (HOSPITAL ORDER, [facility])  LACTIC ACID, PLASMA  PREGNANCY, URINE  APTT  PROTIME-INR  LACTIC ACID, PLASMA    EKG None  Radiology DG Chest 2 View  Result Date: 12/09/2019 CLINICAL DATA:  59 year old female with fever. EXAM: CHEST - 2 VIEW COMPARISON:  Chest radiograph dated 08/30/2008. FINDINGS: The heart size and mediastinal contours are within normal limits. Both lungs are clear. The visualized skeletal structures are unremarkable.  IMPRESSION: No active cardiopulmonary disease. Electronically Signed   By: Anner Crete M.D.   On: 12/09/2019 22:13    Procedures Procedures (including critical care time)  Medications Ordered in ED Medications  sodium chloride flush (NS) 0.9 % injection 3 mL (3 mLs Intravenous Not Given 12/09/19 2119)  ceFEPIme (MAXIPIME) 2 g in sodium chloride 0.9 % 100 mL IVPB (has no administration in time range)  acetaminophen (TYLENOL) tablet 650 mg (650 mg Oral Given 12/09/19 2123)  ceFEPIme (MAXIPIME) 2 g in sodium chloride 0.9 % 100 mL IVPB ( Intravenous Stopped 12/09/19 2308)  sodium chloride 0.9 % bolus 1,000 mL (1,000 mLs Intravenous New Bag/Given 12/09/19 2239)  iohexol (OMNIPAQUE) 300 MG/ML solution 100 mL (100 mLs Intravenous Contrast Given 12/09/19 2310)    ED Course  I have reviewed the triage vital signs and the nursing notes.  Pertinent labs & imaging results that were available during my care of the patient were reviewed by me and considered in my medical decision making (see chart for details).  Clinical Course as of Dec 08 2309  Fri Dec 09, 2019  2153 Temp(!): 103.3 F (39.6 C) [CA]  2153 Pulse Rate(!): 133 [CA]  2205 Code sepsis initiated after initial evaluation. Suspect secondary to pyelonephritis.   [CA]  2301 Hgb urine dipstickMarland Kitchen): MODERATE [CA]  2301 Leukocytes,Ua(!): LARGE [CA]  2301 Bacteria, UA(!): MANY [CA]  2301 Sodium(!): 133 [CA]    Clinical Course  User Index [CA] Karie Kirks   MDM Rules/Calculators/A&P                         59 year old female presents to the ED due to fever, body aches, hematuria, dysuria, low back pain, body aches since Monday.  Patient was diagnosed with a UTI today at urgent care and given 1 g of Rocephin and prescribed ciprofloxacin which she is taken 1 dose of.  She returns to the ED today due to worsening fever.  Upon arrival, patient febrile at 103.3 F and tachycardic at 133.  Code sepsis initiated after initial evaluation likely secondary to pyelonephritis.  Positive left CVA tenderness.  Sepsis labs ordered.  IV cefepime and IV fluids given. Will obtain CT abdomen to rule out abscess formation vs. Infected stone. Patient will most likely need admission for treatment of urosepsis.  Covid test ordered.  CBC reassuring with no leukocytosis.  Normal lactic acid at 1.4.  CMP significant for mild hyponatremia 133 and hyperglycemia at 153 with no anion gap.  Doubt DKA.  UA significant for moderate hematuria, large leukocytes, and many bacteria.  Chest x-ray personally reviewed which is negative for signs of pneumonia, pneumothorax, or widened mediastinum.  EKG personally reviewed which demonstrates normal sinus rhythm with no signs of acute ischemia.  Patient handed off to Dr. Leonides Schanz pending CT abdomen and admission.  Final Clinical Impression(s) / ED Diagnoses Final diagnoses:  Sepsis, due to unspecified organism, unspecified whether acute organ dysfunction present [facility])    Rx / DC Orders ED Discharge Orders    None       Suzy Bouchard, PA-C 12/09/19 2313    Ward, Delice Bison, DO 12/10/19 0006

## 2019-12-09 NOTE — Progress Notes (Signed)
Pharmacy Antibiotic Note  Dana [name] is a 59 y.o. female admitted on 12/09/2019 with UTI.  Pharmacy has been consulted for Cefepime dosing. WBC wnl. Tm 103.67F. SCr wnl   Plan: -Start Cefepime 2 gm IV Q 12 hours -Monitor CBC, renal fx, cultures and clinical progress   Height: 5\' 8"  (172.7 cm) Weight: 67.1 kg (147 lb 14.9 oz) IBW/kg (Calculated) : 63.9  Temp (24hrs), Avg:101.2 F (38.4 C), Min:99 F (37.2 C), Max:103.3 F (39.6 C)  Recent Labs  Lab 12/09/19 2144  WBC 10.3    CrCl cannot be calculated (Patient's most recent lab result is older than the maximum 21 days allowed.).    No Known Allergies    Thank you for allowing pharmacy to be a part of this patient's care.  Albertina Parr, PharmD., BCPS, BCCCP Clinical Pharmacist Clinical phone for 12/09/19 until 11:30pm: [phone] If after 11:30pm, please refer to [facility] for unit-specific pharmacist

## 2019-12-10 ENCOUNTER — Other Ambulatory Visit: Payer: Self-pay

## 2019-12-10 ENCOUNTER — Inpatient Hospital Stay (HOSPITAL_COMMUNITY)
Admission: EM | Admit: 2019-12-10 | Discharge: 2019-12-13 | DRG: 872 | Disposition: A | Discharge status: Home / Self Care | Payer: BC Managed Care – PPO | Attending: Internal Medicine | Admitting: Internal Medicine

## 2019-12-10 ENCOUNTER — Encounter (HOSPITAL_COMMUNITY): Payer: Self-pay | Admitting: Emergency Medicine

## 2019-12-10 DIAGNOSIS — Z9011 Acquired absence of right breast and nipple: Secondary | ICD-10-CM

## 2019-12-10 DIAGNOSIS — Z8659 Personal history of other mental and behavioral disorders: Secondary | ICD-10-CM | POA: Diagnosis present

## 2019-12-10 DIAGNOSIS — R739 Hyperglycemia, unspecified: Secondary | ICD-10-CM | POA: Diagnosis present

## 2019-12-10 DIAGNOSIS — R509 Fever, unspecified: Secondary | ICD-10-CM | POA: Diagnosis not present

## 2019-12-10 DIAGNOSIS — Z791 Long term (current) use of non-steroidal anti-inflammatories (NSAID): Secondary | ICD-10-CM

## 2019-12-10 DIAGNOSIS — N12 Tubulo-interstitial nephritis, not specified as acute or chronic: Secondary | ICD-10-CM | POA: Diagnosis not present

## 2019-12-10 DIAGNOSIS — F32A Depression, unspecified: Secondary | ICD-10-CM | POA: Diagnosis present

## 2019-12-10 DIAGNOSIS — E861 Hypovolemia: Secondary | ICD-10-CM | POA: Diagnosis present

## 2019-12-10 DIAGNOSIS — R319 Hematuria, unspecified: Secondary | ICD-10-CM | POA: Diagnosis present

## 2019-12-10 DIAGNOSIS — Z79899 Other long term (current) drug therapy: Secondary | ICD-10-CM

## 2019-12-10 DIAGNOSIS — K314 Gastric diverticulum: Secondary | ICD-10-CM | POA: Diagnosis present

## 2019-12-10 DIAGNOSIS — N10 Acute tubulo-interstitial nephritis: Secondary | ICD-10-CM | POA: Diagnosis not present

## 2019-12-10 DIAGNOSIS — A419 Sepsis, unspecified organism: Principal | ICD-10-CM | POA: Diagnosis present

## 2019-12-10 DIAGNOSIS — Z20822 Contact with and (suspected) exposure to covid-19: Secondary | ICD-10-CM | POA: Diagnosis present

## 2019-12-10 DIAGNOSIS — Z853 Personal history of malignant neoplasm of breast: Secondary | ICD-10-CM

## 2019-12-10 DIAGNOSIS — E871 Hypo-osmolality and hyponatremia: Secondary | ICD-10-CM | POA: Diagnosis present

## 2019-12-10 DIAGNOSIS — R6889 Other general symptoms and signs: Secondary | ICD-10-CM

## 2019-12-10 DIAGNOSIS — B9620 Unspecified Escherichia coli [E. coli] as the cause of diseases classified elsewhere: Secondary | ICD-10-CM | POA: Diagnosis present

## 2019-12-10 DIAGNOSIS — F329 Major depressive disorder, single episode, unspecified: Secondary | ICD-10-CM | POA: Diagnosis present

## 2019-12-10 DIAGNOSIS — Z9221 Personal history of antineoplastic chemotherapy: Secondary | ICD-10-CM

## 2019-12-10 DIAGNOSIS — N136 Pyonephrosis: Secondary | ICD-10-CM | POA: Diagnosis present

## 2019-12-10 HISTORY — DX: Personal history of malignant neoplasm of breast: Z85.3

## 2019-12-10 HISTORY — DX: Malignant (primary) neoplasm, unspecified: C80.1

## 2019-12-10 LAB — COMPREHENSIVE METABOLIC PANEL
ALT: 13 U/L (ref 0–44)
AST: 15 U/L (ref 15–41)
Albumin: 3.7 g/dL (ref 3.5–5.0)
Alkaline Phosphatase: 46 U/L (ref 38–126)
Anion gap: 9 (ref 5–15)
BUN: 9 mg/dL (ref 6–20)
CO2: 24 mmol/L (ref 22–32)
Calcium: 8.5 mg/dL — ABNORMAL LOW (ref 8.9–10.3)
Chloride: 100 mmol/L (ref 98–111)
Creatinine, Ser: 0.66 mg/dL (ref 0.44–1.00)
GFR calc Af Amer: >60 mL/min (ref >60)
GFR calc non Af Amer: >60 mL/min (ref >60)
Glucose, Bld: 122 mg/dL — ABNORMAL HIGH (ref 70–99)
Potassium: 3.7 mmol/L (ref 3.5–5.1)
Sodium: 133 mmol/L — ABNORMAL LOW (ref 135–145)
Total Bilirubin: 0.4 mg/dL (ref 0.3–1.2)
Total Protein: 7 g/dL (ref 6.5–8.1)

## 2019-12-10 LAB — CBC WITH DIFFERENTIAL/PLATELET
Abs Immature Granulocytes: 0.03 10*3/uL (ref 0.00–0.07)
Basophils Absolute: 0.1 10*3/uL (ref 0.0–0.1)
Basophils Relative: 1 %
Eosinophils Absolute: 0 10*3/uL (ref 0.0–0.5)
Eosinophils Relative: 0 %
HCT: 36.5 % (ref 36.0–46.0)
Hemoglobin: 12.2 g/dL (ref 12.0–15.0)
Immature Granulocytes: 0 %
Lymphocytes Relative: 11 %
Lymphs Abs: 1.1 10*3/uL (ref 0.7–4.0)
MCH: 32.2 pg (ref 26.0–34.0)
MCHC: 33.4 g/dL (ref 30.0–36.0)
MCV: 96.3 fL (ref 80.0–100.0)
Monocytes Absolute: 1.1 10*3/uL — ABNORMAL HIGH (ref 0.1–1.0)
Monocytes Relative: 12 %
Neutro Abs: 7.2 10*3/uL (ref 1.7–7.7)
Neutrophils Relative %: 76 %
Platelets: 251 10*3/uL (ref 150–400)
RBC: 3.79 MIL/uL — ABNORMAL LOW (ref 3.87–5.11)
RDW: 12.1 % (ref 11.5–15.5)
WBC: 9.4 10*3/uL (ref 4.0–10.5)
nRBC: 0 % (ref 0.0–0.2)

## 2019-12-10 LAB — URINALYSIS, ROUTINE W REFLEX MICROSCOPIC
Bacteria, UA: NONE SEEN
Bilirubin Urine: NEGATIVE
Glucose, UA: NEGATIVE mg/dL
Ketones, ur: NEGATIVE mg/dL
Nitrite: NEGATIVE
Protein, ur: 30 mg/dL — AB
Specific Gravity, Urine: 1.01 (ref 1.005–1.030)
WBC, UA: >50 WBC/hpf — ABNORMAL HIGH (ref 0–5)
pH: 6 (ref 5.0–8.0)

## 2019-12-10 LAB — LACTIC ACID, PLASMA
Lactic Acid, Venous: 0.7 mmol/L (ref 0.5–1.9)
Lactic Acid, Venous: 0.8 mmol/L (ref 0.5–1.9)

## 2019-12-10 MED ORDER — SODIUM CHLORIDE 0.9 % IV SOLN
1.0000 g | Freq: Once | INTRAVENOUS | Status: AC
  Administered 2019-12-10: 1 g via INTRAVENOUS
  Filled 2019-12-10: qty 1

## 2019-12-10 MED ORDER — ACETAMINOPHEN 325 MG PO TABS
650.0000 mg | ORAL_TABLET | Freq: Once | ORAL | Status: AC | PRN
  Administered 2019-12-10: 650 mg via ORAL
  Filled 2019-12-10: qty 2

## 2019-12-10 MED ORDER — CIPROFLOXACIN IN D5W 400 MG/200ML IV SOLN
400.0000 mg | Freq: Once | INTRAVENOUS | Status: AC
  Administered 2019-12-10: 400 mg via INTRAVENOUS
  Filled 2019-12-10: qty 200

## 2019-12-10 NOTE — ED Triage Notes (Addendum)
Patient reports diagnosed with pyelonephritis and tx with IV abx at [facility] yesterday. C/o persistent fever. Febrile in triage.

## 2019-12-10 NOTE — Discharge Instructions (Addendum)
You may alternate Tylenol 1000 mg every 6 hours as needed for pain, fever and Ibuprofen 800 mg every 8 hours as needed for pain, fever.  Please take Ibuprofen with food.  Do not take more than 4000 mg of Tylenol (acetaminophen) in a 24 hour period.  Please continue your ciprofloxacin 500 mg twice a day for 1 week.  This course of antibiotics should be sufficient and you do not need to extend it as we had previously discussed.  If you notice that you do not seem to be improving in the next 48 to 72 hours or you feel that you are getting worse with increasing pain, begin to vomit and are unable to keep your antibiotic down, continue to have fevers, please return to the emergency department.

## 2019-12-10 NOTE — H&P (Addendum)
History and Physical    Dana [name] MWU:[id number] DOB: [birth year] DOA: 12/10/2019  PCP: Hali Marry, MD  Patient coming from: Home  I have personally briefly reviewed patient's old medical records in [place]  Chief Complaint: Fever  HPI: Dana [name] is a 59 y.o. female with medical history significant of breast cancer approximately 11 years ago status post mastectomy with augmentation and chemo. No episodes of recurrence. She presents with signs and symptoms of UTI ongoing since 6-21. Symptoms initially started with urinary frequency, the next day developed dysuria, the next day it felt achy all over, the next day developed fever and chills which was essentially low-grade, the next day developed rigors with a fever of 102. States she went to an urgent care where she was given IM Rocephin and placed on Cipro. She filled her prescription for Cipro began taking that at home had increasing fevers up to 104 and more rigors and came to the [facility] on 626. At that time she was given IV cefepime and told to continue her Cipro. This morning she woke up with more energy but then became progressively weak as the day went on had super high fever again at home up to approximately 103 with shaking rigors and came to the hospital.  ED Course: While in the ED today was noted to be febrile at 1016 tachycardic at 103 normal BP 142/85. She had normal labs including a normal white count of 9.4. She had imaging at the [facility] which was essentially normal except for left-sided hydronephrosis.  Review of Systems: As per HPI otherwise 10 point review of systems negative.   Past Medical History:  Diagnosis Date   Breast calcification, right 07/03/2011    Past Surgical History:  Procedure Laterality Date   BREAST LUMPECTOMY     BREAST RECONSTRUCTION     MASTECTOMY, RADICAL  2010   right      reports that she has never smoked. She has never used  smokeless tobacco. She reports current alcohol use of about 3.0 - 4.0 standard drinks of alcohol per week. She reports that she does not use drugs.  No Known Allergies  Family History  Problem Relation Age of Onset   Esophageal cancer Mother    Hypertension Father    Coronary artery disease Father 26   Alzheimer's disease Father      Prior to Admission medications   Medication Sig Start Date End Date Taking? Authorizing Provider  acetaminophen (TYLENOL) 500 MG tablet Take 500 mg by mouth every 6 (six) hours as needed for fever.   Yes [provider]  Calcium Carbonate-Vit D-Min (CALCIUM 1200 PO) Take 1 tablet by mouth daily.   Yes [provider]  ciprofloxacin (CIPRO) 500 MG tablet Take one tab PO Q12hr for 7 days 12/09/19  Yes Kandra Nicolas, MD  FLUoxetine (PROZAC) 10 MG capsule Take 1 capsule (10 mg total) by mouth daily. Dx:f32.9 04/01/19  Yes Hali Marry, MD  ibuprofen (ADVIL) 200 MG tablet Take 200 mg by mouth every 6 (six) hours as needed for fever.   Yes [provider]  Multiple Vitamins-Minerals (MULTIVITAMIN ADULT PO) Take 1 tablet by mouth daily.   Yes [provider]  Omega-3 Fatty Acids (FISH OIL) 1000 MG CAPS Take 1 capsule by mouth daily.   Yes [provider]  meloxicam (MOBIC) 15 MG tablet One tab PO qAM with a meal for 2 weeks, then daily prn pain.  Patient not taking: Reported on 12/10/2019 07/11/19   Silverio Decamp, MD    Physical Exam: Vitals:   12/10/19 1955  BP: (!) 142/85  Pulse: (!) 103  Resp: 16  Temp: (!) 101.6 F (38.7 C)  TempSrc: Oral  SpO2: 98%  Weight: 65.8 kg  Height: 5\' 8"  (1.727 m)    Constitutional: NAD, calm, comfortable Eyes: PERRL, lids and conjunctivae normal ENMT: Mucous membranes are moist. Posterior pharynx clear of any exudate or lesions.Normal dentition.  Neck: normal, supple, no masses, no thyromegaly Respiratory: clear to auscultation bilaterally, no wheezing,  no crackles. Normal respiratory effort. No accessory muscle use.  Cardiovascular: Regular rate and rhythm, no murmurs / rubs / gallops. No extremity edema. 2+ pedal pulses. No carotid bruits.  Abdomen: no tenderness, no masses palpated. No hepatosplenomegaly. Bowel sounds positive.  Musculoskeletal: no clubbing / cyanosis. No joint deformity upper and lower extremities. Good ROM, no contractures. Normal muscle tone.  No CVA tenderness Skin: no rashes, lesions, ulcers. No induration Neurologic: CN 2-12 grossly intact. Sensation intact, DTR normal. Strength 5/5 in all 4.  Psychiatric: Normal judgment and insight. Alert and oriented x 3. Normal mood.   Labs on Admission: I have personally reviewed following labs and imaging studies  CBC: Recent Labs  Lab 12/09/19 2144 12/10/19 1959  WBC 10.3 9.4  NEUTROABS 8.8* 7.2  HGB 13.4 12.2  HCT 41.0 36.5  MCV 95.6 96.3  PLT 283 671   Basic Metabolic Panel: Recent Labs  Lab 12/09/19 2144 12/10/19 1959  NA 133* 133*  K 3.7 3.7  CL 96* 100  CO2 25 24  GLUCOSE 153* 122*  BUN 16 9  CREATININE 0.69 0.66  CALCIUM 8.8* 8.5*   GFR: Estimated Creatinine Clearance: 76.4 mL/min (by C-G formula based on SCr of 0.66 mg/dL). Liver Function Tests: Recent Labs  Lab 12/09/19 2144 12/10/19 1959  AST 18 15  ALT 15 13  ALKPHOS 52 46  BILITOT 1.0 0.4  PROT 7.6 7.0  ALBUMIN 4.1 3.7   Coagulation Profile: Recent Labs  Lab 12/09/19 2144  INR 1.0   Urine analysis:    Component Value Date/Time   COLORURINE YELLOW 12/10/2019 2012   APPEARANCEUR CLEAR 12/10/2019 2012   LABSPEC 1.010 12/10/2019 2012   PHURINE 6.0 12/10/2019 2012   GLUCOSEU NEGATIVE 12/10/2019 2012   HGBUR SMALL (A) 12/10/2019 2012   BILIRUBINUR NEGATIVE 12/10/2019 2012   BILIRUBINUR negative 12/09/2019 [place] 12/10/2019 2012   PROTEINUR 30 (A) 12/10/2019 2012   UROBILINOGEN 0.2 12/09/2019 1541   NITRITE NEGATIVE 12/10/2019 2012   LEUKOCYTESUR LARGE (A)  12/10/2019 2012    Radiological Exams on Admission: DG Chest 2 View  Result Date: 12/09/2019 CLINICAL DATA:  59 year old female with fever. EXAM: CHEST - 2 VIEW COMPARISON:  Chest radiograph dated 08/30/2008. FINDINGS: The heart size and mediastinal contours are within normal limits. Both lungs are clear. The visualized skeletal structures are unremarkable. IMPRESSION: No active cardiopulmonary disease. Electronically Signed   By: Anner Crete M.D.   On: 12/09/2019 22:13   CT ABDOMEN PELVIS W CONTRAST  Result Date: 12/09/2019 CLINICAL DATA:  Dysuria. EXAM: CT ABDOMEN AND PELVIS WITH CONTRAST TECHNIQUE: Multidetector CT imaging of the abdomen and pelvis was performed using the standard protocol following bolus administration of intravenous contrast. CONTRAST:  176mL OMNIPAQUE IOHEXOL 300 MG/ML  SOLN COMPARISON:  None. FINDINGS: Lower chest: No acute abnormality. Bilateral breast implants are seen. Hepatobiliary: No focal liver abnormality is seen. No gallstones, gallbladder wall  thickening, or biliary dilatation. Pancreas: Unremarkable. No pancreatic ductal dilatation or surrounding inflammatory changes. Spleen: Normal in size without focal abnormality. Adrenals/Urinary Tract: Adrenal glands are unremarkable. Kidneys are normal in size. An 8 mm cyst is seen within the anterior aspect of the mid left kidney. There is mild left-sided hydronephrosis. Bladder is unremarkable. Stomach/Bowel: A 5.3 cm x 5.6 cm gastric diverticulum is seen along the posterior aspect of the gastric fundus. Appendix appears normal. No evidence of bowel wall thickening, distention, or inflammatory changes. Vascular/Lymphatic: No significant vascular findings are present. No enlarged abdominal or pelvic lymph nodes. Reproductive: Uterus and bilateral adnexa are unremarkable. Other: No abdominal wall hernia or abnormality. No abdominopelvic ascites. Musculoskeletal: No acute or significant osseous findings. IMPRESSION: 1. 5.3 cm x  5.6 cm gastric diverticulum. 2. Mild left-sided hydronephrosis. 3. Bilateral breast implants. Electronically Signed   By: Virgina Norfolk M.D.   On: 12/09/2019 23:33    EKG: Independently reviewed. Sinus, no acute changes  Assessment/Plan Principal Problem:   Acute pyelonephritis Active Problems:   Depression  Acute Pyelonephritis Following discussion with pharmacy, we decided to continue Rocephin pending cultures and sensitivities CT scan does show left hydronephrosis but no stone, if does not turn around quickly we could consider urology consult  Depression Continue Prozac  DVT prophylaxis: Lovenox SQ Code Status: Full code  Family Communication: Patient and husband at bedside Disposition Plan: Home Consults called: None Admission status: Observation   Donnamae Jude MD [facility]  If 7PM-7AM, please contact night-coverage 12/10/2019, 10:50 PM

## 2019-12-11 ENCOUNTER — Encounter (HOSPITAL_COMMUNITY): Payer: Self-pay

## 2019-12-11 DIAGNOSIS — N10 Acute tubulo-interstitial nephritis: Secondary | ICD-10-CM | POA: Diagnosis not present

## 2019-12-11 DIAGNOSIS — R739 Hyperglycemia, unspecified: Secondary | ICD-10-CM | POA: Diagnosis not present

## 2019-12-11 DIAGNOSIS — R6889 Other general symptoms and signs: Secondary | ICD-10-CM | POA: Diagnosis not present

## 2019-12-11 DIAGNOSIS — Z20822 Contact with and (suspected) exposure to covid-19: Secondary | ICD-10-CM | POA: Diagnosis not present

## 2019-12-11 DIAGNOSIS — A419 Sepsis, unspecified organism: Secondary | ICD-10-CM | POA: Diagnosis not present

## 2019-12-11 DIAGNOSIS — E861 Hypovolemia: Secondary | ICD-10-CM | POA: Diagnosis not present

## 2019-12-11 DIAGNOSIS — Z9011 Acquired absence of right breast and nipple: Secondary | ICD-10-CM | POA: Diagnosis not present

## 2019-12-11 DIAGNOSIS — B9620 Unspecified Escherichia coli [E. coli] as the cause of diseases classified elsewhere: Secondary | ICD-10-CM | POA: Diagnosis not present

## 2019-12-11 DIAGNOSIS — N136 Pyonephrosis: Secondary | ICD-10-CM | POA: Diagnosis not present

## 2019-12-11 DIAGNOSIS — F329 Major depressive disorder, single episode, unspecified: Secondary | ICD-10-CM | POA: Diagnosis not present

## 2019-12-11 DIAGNOSIS — Z853 Personal history of malignant neoplasm of breast: Secondary | ICD-10-CM | POA: Diagnosis not present

## 2019-12-11 DIAGNOSIS — Z79899 Other long term (current) drug therapy: Secondary | ICD-10-CM | POA: Diagnosis not present

## 2019-12-11 DIAGNOSIS — E871 Hypo-osmolality and hyponatremia: Secondary | ICD-10-CM | POA: Diagnosis not present

## 2019-12-11 DIAGNOSIS — R509 Fever, unspecified: Secondary | ICD-10-CM | POA: Diagnosis not present

## 2019-12-11 DIAGNOSIS — Z791 Long term (current) use of non-steroidal anti-inflammatories (NSAID): Secondary | ICD-10-CM | POA: Diagnosis not present

## 2019-12-11 DIAGNOSIS — K314 Gastric diverticulum: Secondary | ICD-10-CM | POA: Diagnosis not present

## 2019-12-11 DIAGNOSIS — N12 Tubulo-interstitial nephritis, not specified as acute or chronic: Secondary | ICD-10-CM | POA: Diagnosis not present

## 2019-12-11 DIAGNOSIS — R319 Hematuria, unspecified: Secondary | ICD-10-CM | POA: Diagnosis not present

## 2019-12-11 DIAGNOSIS — Z9221 Personal history of antineoplastic chemotherapy: Secondary | ICD-10-CM | POA: Diagnosis not present

## 2019-12-11 LAB — URINE CULTURE
Culture: NO GROWTH
MICRO NUMBER:: 10637081
SPECIMEN QUALITY:: ADEQUATE

## 2019-12-11 LAB — HIV ANTIBODY (ROUTINE TESTING W REFLEX): HIV Screen 4th Generation wRfx: NONREACTIVE

## 2019-12-11 MED ORDER — SODIUM CHLORIDE 0.9 % IV SOLN
2.0000 g | INTRAVENOUS | Status: DC
  Administered 2019-12-11 – 2019-12-13 (×3): 2 g via INTRAVENOUS
  Filled 2019-12-11: qty 20
  Filled 2019-12-11: qty 2
  Filled 2019-12-11: qty 20
  Filled 2019-12-11: qty 2

## 2019-12-11 MED ORDER — ENOXAPARIN SODIUM 40 MG/0.4ML ~~LOC~~ SOLN
40.0000 mg | SUBCUTANEOUS | Status: DC
  Administered 2019-12-11 – 2019-12-13 (×3): 40 mg via SUBCUTANEOUS
  Filled 2019-12-11 (×4): qty 0.4

## 2019-12-11 MED ORDER — FLUOXETINE HCL 10 MG PO CAPS
10.0000 mg | ORAL_CAPSULE | Freq: Every day | ORAL | Status: DC
  Administered 2019-12-11 – 2019-12-13 (×3): 10 mg via ORAL
  Filled 2019-12-11 (×3): qty 1

## 2019-12-11 MED ORDER — ACETAMINOPHEN 325 MG PO TABS
650.0000 mg | ORAL_TABLET | Freq: Four times a day (QID) | ORAL | Status: DC | PRN
  Administered 2019-12-11 – 2019-12-12 (×4): 650 mg via ORAL
  Filled 2019-12-11 (×4): qty 2

## 2019-12-11 MED ORDER — IBUPROFEN 400 MG PO TABS
600.0000 mg | ORAL_TABLET | Freq: Four times a day (QID) | ORAL | Status: DC | PRN
  Administered 2019-12-11: 600 mg via ORAL
  Filled 2019-12-11: qty 1

## 2019-12-11 MED ORDER — ONDANSETRON HCL 4 MG/2ML IJ SOLN: 4.0000 mg | Freq: Four times a day (QID) | INTRAMUSCULAR | Status: DC | PRN

## 2019-12-11 MED ORDER — ACETAMINOPHEN 650 MG RE SUPP: 650.0000 mg | Freq: Four times a day (QID) | RECTAL | Status: DC | PRN

## 2019-12-11 MED ORDER — POLYETHYLENE GLYCOL 3350 17 G PO PACK: 17.0000 g | PACK | Freq: Every day | ORAL | Status: DC | PRN

## 2019-12-11 MED ORDER — ONDANSETRON HCL 4 MG PO TABS: 4.0000 mg | ORAL_TABLET | Freq: Four times a day (QID) | ORAL | Status: DC | PRN

## 2019-12-11 MED ORDER — TRAZODONE HCL 50 MG PO TABS
25.0000 mg | ORAL_TABLET | Freq: Every evening | ORAL | Status: DC | PRN
  Administered 2019-12-12: 25 mg via ORAL
  Filled 2019-12-11: qty 1

## 2019-12-11 NOTE — ED Notes (Signed)
As I write this, pt. Is soundly sleeping. I have just phoned report to [place], RN on [place]. I will transport shortly.

## 2019-12-11 NOTE — Progress Notes (Addendum)
[facility] PROGRESS NOTE   Dana [name] [id number]:[id number] DOB: [birth year] DOA: 12/10/2019  PCP: Hali Marry, MD  Brief History/Interval Summary: 59 y.o. female with medical history significant of breast cancer approximately 11 years ago status post mastectomy with augmentation and chemo. No episodes of recurrence. She presented with signs and symptoms of UTI ongoing since 6-21. Symptoms initially started with urinary frequency, the next day developed dysuria, the next day it felt achy all over, the next day developed fever and chills which was essentially low-grade, the next day developed rigors with a fever of 102. States she went to an urgent care where she was given IM Rocephin and placed on Cipro. She filled her prescription for Cipro began taking that at home had increasing fevers up to 104 and more rigors and came to the [facility] on 626. At that time she was given IV cefepime and told to continue her Cipro.  However her symptoms persisted so she decided to come into the hospital.  She was hospitalized for further management.    Reason for Visit: Acute pyelonephritis  Consultants: None  Procedures: None  Antibiotics: Anti-infectives (From admission, onward)   Start     Dose/Rate Route Frequency Ordered Stop   12/11/19 0551  cefTRIAXone (ROCEPHIN) 2 g in sodium chloride 0.9 % 100 mL IVPB     Discontinue     2 g 200 mL/hr over 30 Minutes Intravenous Every 24 hours 12/11/19 0551     12/10/19 2200  aztreonam (AZACTAM) 1 g in sodium chloride 0.9 % 100 mL IVPB        1 g 200 mL/hr over 30 Minutes Intravenous  Once 12/10/19 2147 12/10/19 2319   12/10/19 2200  ciprofloxacin (CIPRO) IVPB 400 mg        400 mg 200 mL/hr over 60 Minutes Intravenous  Once 12/10/19 2147 12/10/19 2319      Subjective/Interval History: Patient states that she is feeling slightly better this morning.  Has not had any fever overnight.  Denies any nausea vomiting.  Denies any  abdominal or flank pain.  ROS: No shortness of breath or chest pain    Assessment/Plan:  Acute pyelonephritis with sepsis, POA CT scan showed mild left hydronephrosis but no stone.  Patient is currently on ceftriaxone.  She was on ciprofloxacin prior to admission.  She was also given a dose of cefepime at an outside practice.  Follow-up on culture data.  Positive urine culture reports noted in our system.  Care everywhere reviewed.  Mild hyponatremia Recheck labs tomorrow.  Likely due to hypovolemia.  She was given IV fluids in the ED.  Gastric diverticulum Incidentally noted on CT scan.  Outpatient evaluation.  Remote history of breast cancer Stable.  DVT Prophylaxis: Lovenox Code Status: Full code Family Communication: Discussed with the patient Disposition Plan:  Status is: Observation  The patient will require care spanning > 2 midnights and should be moved to inpatient because: IV treatments appropriate due to intensity of illness or inability to take PO and Inpatient level of care appropriate due to severity of illness  Dispo: The patient is from: Home              Anticipated d/c is to: Home              Anticipated d/c date is: 3 days              Patient currently is not medically stable to d/c.  Medications:  Scheduled: . enoxaparin (LOVENOX) injection  40 mg Subcutaneous Q24H  . FLUoxetine  10 mg Oral Daily   Continuous: . cefTRIAXone (ROCEPHIN)  IV Stopped (12/11/19 0706)   EQA:[id number] **OR** acetaminophen, ibuprofen, ondansetron **OR** ondansetron (ZOFRAN) IV, polyethylene glycol, traZODone   Objective:  Vital Signs  Vitals:   12/10/19 2300 12/11/19 0143 12/11/19 0604 12/11/19 0807  BP: 117/74 123/76 128/70 130/78  Pulse: 72 73 85 87  Resp: 20 17 16 18   Temp:    99.3 F (37.4 C)  TempSrc:    Oral  SpO2: 99% 100% 97% 100%  Weight:      Height:       No intake or output data in the 24 hours ending 12/11/19 1013 Filed Weights    12/10/19 1955  Weight: 65.8 kg    General appearance: Awake alert.  In no distress Resp: Clear to auscultation bilaterally.  Normal effort Cardio: S1-S2 is normal regular.  No S3-S4.  No rubs murmurs or bruit GI: Abdomen is soft.  Nontender nondistended.  Bowel sounds are present normal.  No masses organomegaly.  No CVA tenderness. Extremities: No edema.  Full range of motion of lower extremities. Neurologic: Alert and oriented x3.  No focal neurological deficits.    Lab Results:  Data Reviewed: I have personally reviewed following labs and imaging studies  CBC: Recent Labs  Lab 12/09/19 2144 12/10/19 1959  WBC 10.3 9.4  NEUTROABS 8.8* 7.2  HGB 13.4 12.2  HCT 41.0 36.5  MCV 95.6 96.3  PLT 283 921    Basic Metabolic Panel: Recent Labs  Lab 12/09/19 2144 12/10/19 1959  NA 133* 133*  K 3.7 3.7  CL 96* 100  CO2 25 24  GLUCOSE 153* 122*  BUN 16 9  CREATININE 0.69 0.66  CALCIUM 8.8* 8.5*    GFR: Estimated Creatinine Clearance: 76.4 mL/min (by C-G formula based on SCr of 0.66 mg/dL).  Liver Function Tests: Recent Labs  Lab 12/09/19 2144 12/10/19 1959  AST 18 15  ALT 15 13  ALKPHOS 52 46  BILITOT 1.0 0.4  PROT 7.6 7.0  ALBUMIN 4.1 3.7     Coagulation Profile: Recent Labs  Lab 12/09/19 2144  INR 1.0      Recent Results (from the past 240 hour(s))  Blood Culture (routine x 2)     Status: None (Preliminary result)   Collection Time: 12/09/19  9:44 PM   Specimen: BLOOD LEFT ARM  Result Value Ref Range Status   Specimen Description   Final    BLOOD LEFT ARM Performed at [facility], [address] [place], [place] [address]    Special Requests   Final    BOTTLES DRAWN AEROBIC AND ANAEROBIC Blood Culture adequate volume Performed at [facility], [address] [place], [place] [address]    Culture   Final    NO GROWTH < 24 HOURS Performed at [facility], [place] [address] [place], [place] [address]    Report  Status PENDING  Incomplete  Blood Culture (routine x 2)     Status: None (Preliminary result)   Collection Time: 12/09/19 10:32 PM   Specimen: BLOOD LEFT WRIST  Result Value Ref Range Status   Specimen Description   Final    BLOOD LEFT WRIST Performed at [facility], [address] [place], [place] [address]    Special Requests   Final    BOTTLES DRAWN AEROBIC AND ANAEROBIC  Blood Culture adequate volume Performed at [facility], [address] [place], [place] [address]    Culture   Final    NO GROWTH < 24 HOURS Performed at [facility], [place] [address] [place], [place] [address]    Report Status PENDING  Incomplete  SARS Coronavirus 2 by RT PCR (hospital order, performed in [facility] hospital lab) Nasopharyngeal Nasopharyngeal Swab     Status: None   Collection Time: 12/09/19 10:32 PM   Specimen: Nasopharyngeal Swab  Result Value Ref Range Status   SARS Coronavirus 2 NEGATIVE NEGATIVE Final    Comment: (NOTE) SARS-CoV-2 target nucleic acids are NOT DETECTED.  The SARS-CoV-2 RNA is generally detectable in upper and lower respiratory specimens during the acute phase of infection. The lowest concentration of SARS-CoV-2 viral copies this assay can detect is 250 copies / mL. A negative result does not preclude SARS-CoV-2 infection and should not be used as the sole basis for treatment or other patient management decisions.  A negative result may occur with improper specimen collection / handling, submission of specimen other than nasopharyngeal swab, presence of viral mutation(s) within the areas targeted by this assay, and inadequate number of viral copies (<250 copies / mL). A negative result must be combined with clinical observations, patient history, and epidemiological information.  Fact Sheet for Patients:   StrictlyIdeas.no  Fact Sheet for Healthcare Providers: BankingDealers.co.za  This  test is not yet approved or  cleared by the [place] FDA and has been authorized for detection and/or diagnosis of SARS-CoV-2 by FDA under an Emergency Use Authorization (EUA).  This EUA will remain in effect (meaning this test can be used) for the duration of the COVID-19 declaration under Section 564(b)(1) of the Act, 21 U.S.C. section 360bbb-3(b)(1), unless the authorization is terminated or revoked sooner.  Performed at [facility], [address] [place], [place] [address]   Culture, blood (routine x 2)     Status: None (Preliminary result)   Collection Time: 12/10/19 10:10 PM   Specimen: BLOOD LEFT HAND  Result Value Ref Range Status   Specimen Description   Final    BLOOD LEFT HAND Performed at [facility] [place] [address], [place], [place] [address]    Special Requests   Final    BOTTLES DRAWN AEROBIC AND ANAEROBIC Blood Culture adequate volume Performed at [place] [address], [place], [place] [address]    Culture   Final    NO GROWTH < 12 HOURS Performed at [facility] [address] [place], [place] [address]    Report Status PENDING  Incomplete      Radiology Studies: DG Chest 2 View  Result Date: 12/09/2019 CLINICAL DATA:  59 year old female with fever. EXAM: CHEST - 2 VIEW COMPARISON:  Chest radiograph dated 08/30/2008. FINDINGS: The heart size and mediastinal contours are within normal limits. Both lungs are clear. The visualized skeletal structures are unremarkable. IMPRESSION: No active cardiopulmonary disease. Electronically Signed   By: Anner Crete M.D.   On: 12/09/2019 22:13   CT ABDOMEN PELVIS W CONTRAST  Result Date: 12/09/2019 CLINICAL DATA:  Dysuria. EXAM: CT ABDOMEN AND PELVIS WITH CONTRAST TECHNIQUE: Multidetector CT imaging of the abdomen and pelvis was performed using the standard protocol following bolus administration of intravenous contrast. CONTRAST:  155mL OMNIPAQUE  IOHEXOL 300 MG/ML  SOLN COMPARISON:  None. FINDINGS: Lower chest: No acute abnormality. Bilateral breast implants are seen.  Hepatobiliary: No focal liver abnormality is seen. No gallstones, gallbladder wall thickening, or biliary dilatation. Pancreas: Unremarkable. No pancreatic ductal dilatation or surrounding inflammatory changes. Spleen: Normal in size without focal abnormality. Adrenals/Urinary Tract: Adrenal glands are unremarkable. Kidneys are normal in size. An 8 mm cyst is seen within the anterior aspect of the mid left kidney. There is mild left-sided hydronephrosis. Bladder is unremarkable. Stomach/Bowel: A 5.3 cm x 5.6 cm gastric diverticulum is seen along the posterior aspect of the gastric fundus. Appendix appears normal. No evidence of bowel wall thickening, distention, or inflammatory changes. Vascular/Lymphatic: No significant vascular findings are present. No enlarged abdominal or pelvic lymph nodes. Reproductive: Uterus and bilateral adnexa are unremarkable. Other: No abdominal wall hernia or abnormality. No abdominopelvic ascites. Musculoskeletal: No acute or significant osseous findings. IMPRESSION: 1. 5.3 cm x 5.6 cm gastric diverticulum. 2. Mild left-sided hydronephrosis. 3. Bilateral breast implants. Electronically Signed   By: Virgina Norfolk M.D.   On: 12/09/2019 23:33       LOS: 0 days   [place] [facility] Pager on [email or web address]  12/11/2019, 10:13 AM

## 2019-12-11 NOTE — ED Provider Notes (Signed)
[facility] DEPT Provider Note   CSN: [id number] Arrival date & time: 12/10/19  1944     History Chief Complaint  Patient presents with   Fever    Dana [name] is a 59 y.o. female.  HPI Patient started treatment for urinary tract infection and suspected pyelonephritis yesterday afternoon.  She had an IM dose of antibiotics in urgent care and had persistent and elevated fever.  She was seen at the emergency department with CT scan and diagnostic evaluation. Rocephin given and oral Cipro administered.  Patient reports that she has had recurrence of high fever today.  She had fever up to 104 and experienced severe general fatigue and chills.  She did not have any vomiting or diarrhea.  Reports she does have some pain that persists in the left flank area.  He has been taking Tylenol and ibuprofen for fever control but it keeps rebounding.    Past Medical History:  Diagnosis Date   Breast calcification, right 07/03/2011    Patient Active Problem List   Diagnosis Date Noted   Acute pyelonephritis 12/10/2019   Lumbar degenerative disc disease 07/11/2019   IFG (impaired fasting glucose) 03/19/2014   History of right breast cancer 07/03/2011   Depression 07/10/2008    Past Surgical History:  Procedure Laterality Date   BREAST LUMPECTOMY     BREAST RECONSTRUCTION     MASTECTOMY, RADICAL  2010   right      OB History   No obstetric history on file.     Family History  Problem Relation Age of Onset   Esophageal cancer Mother    Hypertension Father    Coronary artery disease Father 24   Alzheimer's disease Father     Social History   Tobacco Use   Smoking status: Never Smoker   Smokeless tobacco: Never Used  Substance Use Topics   Alcohol use: Yes    Alcohol/week: 3.0 - 4.0 standard drinks    Types: 3 - 4 Glasses of wine per week   Drug use: No    Home Medications Prior to Admission medications   Medication  Sig Start Date End Date Taking? Authorizing Provider  acetaminophen (TYLENOL) 500 MG tablet Take 500 mg by mouth every 6 (six) hours as needed for fever.   Yes [provider]  Calcium Carbonate-Vit D-Min (CALCIUM 1200 PO) Take 1 tablet by mouth daily.   Yes [provider]  ciprofloxacin (CIPRO) 500 MG tablet Take one tab PO Q12hr for 7 days 12/09/19  Yes Kandra Nicolas, MD  FLUoxetine (PROZAC) 10 MG capsule Take 1 capsule (10 mg total) by mouth daily. Dx:f32.9 04/01/19  Yes Hali Marry, MD  ibuprofen (ADVIL) 200 MG tablet Take 200 mg by mouth every 6 (six) hours as needed for fever.   Yes [provider]  Multiple Vitamins-Minerals (MULTIVITAMIN ADULT PO) Take 1 tablet by mouth daily.   Yes [provider]  Omega-3 Fatty Acids (FISH OIL) 1000 MG CAPS Take 1 capsule by mouth daily.   Yes [provider]    Allergies    Patient has no known allergies.  Review of Systems   Review of Systems 10 Systems reviewed and are negative for acute change except as noted in the HPI.  Physical Exam Updated Vital Signs BP 117/74    Pulse 72    Temp (!) 101.6 F (38.7 C) (Oral)    Resp 20    Ht 5\' 8"  (1.727 m)  Wt 65.8 kg    SpO2 99%    BMI 22.05 kg/m   Physical Exam Constitutional:      Comments: Patient is alert and appropriate.  No confusion.  No respiratory distress  HENT:     Head: Normocephalic and atraumatic.  Eyes:     Extraocular Movements: Extraocular movements intact.  Cardiovascular:     Rate and Rhythm: Normal rate and regular rhythm.  Pulmonary:     Effort: Pulmonary effort is normal.     Breath sounds: Normal breath sounds.  Abdominal:     Comments: Abdomen soft.  Mild suprapubic discomfort.  Moderate left flank pain to deeper palpation.  Musculoskeletal:        General: No swelling or tenderness. Normal range of motion.     Right lower leg: No edema.     Left lower leg: No edema.  Skin:    General: Skin is warm and  dry.  Neurological:     General: No focal deficit present.     Mental Status: She is oriented to person, place, and time.     Coordination: Coordination normal.  Psychiatric:        Mood and Affect: Mood normal.     ED Results / Procedures / Treatments   Labs (all labs ordered are listed, but only abnormal results are displayed) Labs Reviewed  COMPREHENSIVE METABOLIC PANEL - Abnormal; Notable for the following components:      Result Value   Sodium 133 (*)    Glucose, Bld 122 (*)    Calcium 8.5 (*)    All other components within normal limits  CBC WITH DIFFERENTIAL/PLATELET - Abnormal; Notable for the following components:   RBC 3.79 (*)    Monocytes Absolute 1.1 (*)    All other components within normal limits  URINALYSIS, ROUTINE W REFLEX MICROSCOPIC - Abnormal; Notable for the following components:   Hgb urine dipstick SMALL (*)    Protein, ur 30 (*)    Leukocytes,Ua LARGE (*)    WBC, UA >50 (*)    Non Squamous Epithelial 0-5 (*)    All other components within normal limits  CULTURE, BLOOD (ROUTINE X 2)  CULTURE, BLOOD (ROUTINE X 2)  URINE CULTURE  LACTIC ACID, PLASMA  LACTIC ACID, PLASMA    EKG None  Radiology DG Chest 2 View  Result Date: 12/09/2019 CLINICAL DATA:  59 year old female with fever. EXAM: CHEST - 2 VIEW COMPARISON:  Chest radiograph dated 08/30/2008. FINDINGS: The heart size and mediastinal contours are within normal limits. Both lungs are clear. The visualized skeletal structures are unremarkable. IMPRESSION: No active cardiopulmonary disease. Electronically Signed   By: Anner Crete M.D.   On: 12/09/2019 22:13   CT ABDOMEN PELVIS W CONTRAST  Result Date: 12/09/2019 CLINICAL DATA:  Dysuria. EXAM: CT ABDOMEN AND PELVIS WITH CONTRAST TECHNIQUE: Multidetector CT imaging of the abdomen and pelvis was performed using the standard protocol following bolus administration of intravenous contrast. CONTRAST:  11mL OMNIPAQUE IOHEXOL 300 MG/ML  SOLN  COMPARISON:  None. FINDINGS: Lower chest: No acute abnormality. Bilateral breast implants are seen. Hepatobiliary: No focal liver abnormality is seen. No gallstones, gallbladder wall thickening, or biliary dilatation. Pancreas: Unremarkable. No pancreatic ductal dilatation or surrounding inflammatory changes. Spleen: Normal in size without focal abnormality. Adrenals/Urinary Tract: Adrenal glands are unremarkable. Kidneys are normal in size. An 8 mm cyst is seen within the anterior aspect of the mid left kidney. There is mild left-sided hydronephrosis. Bladder is unremarkable. Stomach/Bowel: A 5.3 cm  x 5.6 cm gastric diverticulum is seen along the posterior aspect of the gastric fundus. Appendix appears normal. No evidence of bowel wall thickening, distention, or inflammatory changes. Vascular/Lymphatic: No significant vascular findings are present. No enlarged abdominal or pelvic lymph nodes. Reproductive: Uterus and bilateral adnexa are unremarkable. Other: No abdominal wall hernia or abnormality. No abdominopelvic ascites. Musculoskeletal: No acute or significant osseous findings. IMPRESSION: 1. 5.3 cm x 5.6 cm gastric diverticulum. 2. Mild left-sided hydronephrosis. 3. Bilateral breast implants. Electronically Signed   By: Virgina Norfolk M.D.   On: 12/09/2019 23:33    Procedures Procedures (including critical care time)  Medications Ordered in ED Medications  acetaminophen (TYLENOL) tablet 650 mg (650 mg Oral Given 12/10/19 2014)  aztreonam (AZACTAM) 1 g in sodium chloride 0.9 % 100 mL IVPB (0 g Intravenous Stopped 12/10/19 2319)  ciprofloxacin (CIPRO) IVPB 400 mg (0 mg Intravenous Stopped 12/10/19 2319)    ED Course  I have reviewed the triage vital signs and the nursing notes.  Pertinent labs & imaging results that were available during my care of the patient were reviewed by me and considered in my medical decision making (see chart for details).    MDM Rules/Calculators/A&P                           Consult: Hospitalist for admission.  Patient has had recurrence of high fever in the setting of urinary tract infection and suspected pyelonephritis.  She did have IM antibiotics and IV Rocephin in the last 24 hours.  She continues to have rigors.  Patient did experience some periods of confusion yesterday.  Urinalysis continues to show greater than 50 WBCs.  Patient's fevers been up to 104.  At this time was persisting symptoms concerning for SIRS we will plan for IV antibiotic broad-spectrum awaiting culture results.  Patient did have CT yesterday without acute findings.  Will not repeat CT at this time.  Patient's mental status is clear.  No respiratory distress. Final Clinical Impression(s) / ED Diagnoses Final diagnoses:  Pyelonephritis  Fever, unspecified fever cause  Rigors    Rx / DC Orders ED Discharge Orders    None       Charlesetta Shanks, MD 12/11/19 [phone]

## 2019-12-11 NOTE — Plan of Care (Signed)

## 2019-12-12 LAB — URINE CULTURE: Culture: NO GROWTH

## 2019-12-12 LAB — COMPREHENSIVE METABOLIC PANEL
ALT: 12 U/L (ref 0–44)
AST: 14 U/L — ABNORMAL LOW (ref 15–41)
Albumin: 3.2 g/dL — ABNORMAL LOW (ref 3.5–5.0)
Alkaline Phosphatase: 38 U/L (ref 38–126)
Anion gap: 8 (ref 5–15)
BUN: 9 mg/dL (ref 6–20)
CO2: 25 mmol/L (ref 22–32)
Calcium: 8.4 mg/dL — ABNORMAL LOW (ref 8.9–10.3)
Chloride: 103 mmol/L (ref 98–111)
Creatinine, Ser: 0.58 mg/dL (ref 0.44–1.00)
GFR calc Af Amer: >60 mL/min (ref >60)
GFR calc non Af Amer: >60 mL/min (ref >60)
Glucose, Bld: 107 mg/dL — ABNORMAL HIGH (ref 70–99)
Potassium: 4.4 mmol/L (ref 3.5–5.1)
Sodium: 136 mmol/L (ref 135–145)
Total Bilirubin: 0.4 mg/dL (ref 0.3–1.2)
Total Protein: 6.9 g/dL (ref 6.5–8.1)

## 2019-12-12 LAB — CBC
HCT: 36.1 % (ref 36.0–46.0)
Hemoglobin: 12.1 g/dL (ref 12.0–15.0)
MCH: 31.3 pg (ref 26.0–34.0)
MCHC: 33.5 g/dL (ref 30.0–36.0)
MCV: 93.3 fL (ref 80.0–100.0)
Platelets: 255 10*3/uL (ref 150–400)
RBC: 3.87 MIL/uL (ref 3.87–5.11)
RDW: 11.9 % (ref 11.5–15.5)
WBC: 8.2 10*3/uL (ref 4.0–10.5)
nRBC: 0 % (ref 0.0–0.2)

## 2019-12-12 MED ORDER — ALUM & MAG HYDROXIDE-SIMETH 200-200-20 MG/5ML PO SUSP
30.0000 mL | ORAL | Status: DC | PRN
  Administered 2019-12-12: 30 mL via ORAL
  Filled 2019-12-12: qty 30

## 2019-12-12 NOTE — Progress Notes (Signed)
[facility] PROGRESS NOTE   Dana [name] YSA:[id number] DOB: 1960-07-18 DOA: 12/10/2019  PCP: Hali Marry, MD  Brief History/Interval Summary: 59 y.o. female with medical history significant of breast cancer approximately 11 years ago status post mastectomy with augmentation and chemo. No episodes of recurrence. She presented with signs and symptoms of UTI ongoing since 6-21. Symptoms initially started with urinary frequency, the next day developed dysuria, the next day it felt achy all over, the next day developed fever and chills which was essentially low-grade, the next day developed rigors with a fever of 102. States she went to an urgent care where she was given IM Rocephin and placed on Cipro. She filled her prescription for Cipro began taking that at home had increasing fevers up to 104 and more rigors and came to the [facility] on 626. At that time she was given IV cefepime and told to continue her Cipro.  However her symptoms persisted so she decided to come into the hospital.  She was hospitalized for further management.    Reason for Visit: Acute pyelonephritis  Consultants: None  Procedures: None  Antibiotics: Anti-infectives (From admission, onward)   Start     Dose/Rate Route Frequency Ordered Stop   12/11/19 0551  cefTRIAXone (ROCEPHIN) 2 g in sodium chloride 0.9 % 100 mL IVPB     Discontinue     2 g 200 mL/hr over 30 Minutes Intravenous Every 24 hours 12/11/19 0551     12/10/19 2200  aztreonam (AZACTAM) 1 g in sodium chloride 0.9 % 100 mL IVPB        1 g 200 mL/hr over 30 Minutes Intravenous  Once 12/10/19 2147 12/10/19 2319   12/10/19 2200  ciprofloxacin (CIPRO) IVPB 400 mg        400 mg 200 mL/hr over 60 Minutes Intravenous  Once 12/10/19 2147 12/10/19 2319      Subjective/Interval History: Patient again had fever and chills yesterday.  Feeling better this morning.  Denies any nausea vomiting.  Denies any abdominal or flank pain.   No dysuria or hematuria.  No diarrhea.  No skin rashes.  ROS: No shortness of breath or chest pain.    Assessment/Plan:  Acute pyelonephritis with sepsis, POA CT scan showed mild left hydronephrosis but no stone.   Patient was placed on ciprofloxacin as an outpatient but due to persistent fevers she had to be hospitalized.  She was also given a dose of cefepime at an outside practice. She remains on ceftriaxone.  She again had fever and chills yesterday.  3 urine cultures have been sent.  One of them resulted as showing more than 100,000 colonies of E. Coli.  Sensitivities reviewed.  Continue ceftriaxone for now.  Blood cultures are pending.  The other 2 urine cultures report no growth.   No clear indication to repeat imaging studies at this time.  Discussed with patient.   Mild hyponatremia Likely due to hypovolemia.  Sodium levels are normal today.    Gastric diverticulum Incidentally noted on CT scan.  Outpatient evaluation.  Remote history of breast cancer Stable.  DVT Prophylaxis: Lovenox Code Status: Full code Family Communication: Discussed with the patient Disposition Plan:  Status is: Inpatient  Remains inpatient appropriate because:IV treatments appropriate due to intensity of illness or inability to take PO   Dispo:  Patient From: Home  Planned Disposition: Home  Expected discharge date: 12/14/19  Medically stable for discharge: No      Medications:  Scheduled: .  enoxaparin (LOVENOX) injection  40 mg Subcutaneous Q24H  . FLUoxetine  10 mg Oral Daily   Continuous: . cefTRIAXone (ROCEPHIN)  IV 2 g (12/12/19 0515)   RDE:YCXKGYJEHUDJS **OR** acetaminophen, ibuprofen, ondansetron **OR** ondansetron (ZOFRAN) IV, polyethylene glycol, traZODone   Objective:  Vital Signs  Vitals:   12/11/19 2303 12/12/19 0003 12/12/19 0150 12/12/19 0556  BP:  124/77 115/68 117/70  Pulse:  72 60 (!) 57  Resp:  18 18 18   Temp: 100.3 F (37.9 C) 98.6 F (37 C) 98.1 F  (36.7 C) 97.8 F (36.6 C)  TempSrc:  Oral Oral   SpO2:  97% 99% 99%  Weight:      Height:        Intake/Output Summary (Last 24 hours) at 12/12/2019 0934 Last data filed at 12/12/2019 0600 Gross per 24 hour  Intake 910 ml  Output 700 ml  Net 210 ml   Filed Weights   12/10/19 1955  Weight: 65.8 kg    General appearance: Awake alert.  In no distress Resp: Clear to auscultation bilaterally.  Normal effort Cardio: S1-S2 is normal regular.  No S3-S4.  No rubs murmurs or bruit GI: Abdomen is soft.  Nontender nondistended.  Bowel sounds are present normal.  No masses organomegaly Extremities: No edema.  Full range of motion of lower extremities. Neurologic: Alert and oriented x3.  No focal neurological deficits.    Lab Results:  Data Reviewed: I have personally reviewed following labs and imaging studies  CBC: Recent Labs  Lab 12/09/19 2144 12/10/19 1959 12/12/19 0536  WBC 10.3 9.4 8.2  NEUTROABS 8.8* 7.2  --   HGB 13.4 12.2 12.1  HCT 41.0 36.5 36.1  MCV 95.6 96.3 93.3  PLT 283 251 970    Basic Metabolic Panel: Recent Labs  Lab 12/09/19 2144 12/10/19 1959 12/12/19 0536  NA 133* 133* 136  K 3.7 3.7 4.4  CL 96* 100 103  CO2 25 24 25   GLUCOSE 153* 122* 107*  BUN 16 9 9   CREATININE 0.69 0.66 0.58  CALCIUM 8.8* 8.5* 8.4*    GFR: Estimated Creatinine Clearance: 76.4 mL/min (by C-G formula based on SCr of 0.58 mg/dL).  Liver Function Tests: Recent Labs  Lab 12/09/19 2144 12/10/19 1959 12/12/19 0536  AST 18 15 14*  ALT 15 13 12   ALKPHOS 52 46 38  BILITOT 1.0 0.4 0.4  PROT 7.6 7.0 6.9  ALBUMIN 4.1 3.7 3.2*     Coagulation Profile: Recent Labs  Lab 12/09/19 2144  INR 1.0      Recent Results (from the past 240 hour(s))  Urine culture     Status: Abnormal   Collection Time: 12/09/19  3:42 PM   Specimen: Urine  Result Value Ref Range Status   MICRO NUMBER: [id number]  Final   SPECIMEN QUALITY: Adequate  Final   Sample Source URINE  Final    STATUS: FINAL  Final   ISOLATE 1: Escherichia coli (A)  Final    Comment: Greater than 100,000 CFU/mL of Escherichia coli      Susceptibility   Escherichia coli - URINE CULTURE, REFLEX    AMOX/CLAVULANIC <=2 Sensitive     AMPICILLIN <=2 Sensitive     AMPICILLIN/SULBACTAM <=2 Sensitive     CEFAZOLIN* <=4 Not Reportable      * For infections other than uncomplicated UTIcaused by E. coli, K. pneumoniae or P. mirabilis:Cefazolin is resistant if MIC > or = 8 mcg/mL.(Distinguishing susceptible versus intermediatefor isolates with MIC < or = 4  mcg/mL requiresadditional testing.)For uncomplicated UTI caused by E. coli,K. pneumoniae or P. mirabilis: Cefazolin issusceptible if MIC <32 mcg/mL and predictssusceptible to the oral agents cefaclor, cefdinir,cefpodoxime, cefprozil, cefuroxime, cephalexinand loracarbef.    CEFEPIME <=1 Sensitive     CEFTRIAXONE <=1 Sensitive     CIPROFLOXACIN <=0.25 Sensitive     LEVOFLOXACIN <=0.12 Sensitive     ERTAPENEM <=0.5 Sensitive     GENTAMICIN <=1 Sensitive     IMIPENEM <=0.25 Sensitive     NITROFURANTOIN <=16 Sensitive     PIP/TAZO <=4 Sensitive     TOBRAMYCIN <=1 Sensitive     TRIMETH/SULFA* <=20 Sensitive      * For infections other than uncomplicated UTIcaused by E. coli, K. pneumoniae or P. mirabilis:Cefazolin is resistant if MIC > or = 8 mcg/mL.(Distinguishing susceptible versus intermediatefor isolates with MIC < or = 4 mcg/mL requiresadditional testing.)For uncomplicated UTI caused by E. coli,K. pneumoniae or P. mirabilis: Cefazolin issusceptible if MIC <32 mcg/mL and predictssusceptible to the oral agents cefaclor, cefdinir,cefpodoxime, cefprozil, cefuroxime, cephalexinand loracarbef.Legend:S = Susceptible  I = IntermediateR = Resistant  NS = Not susceptible* = Not tested  NR = Not reported**NN = See antimicrobic comments  Blood Culture (routine x 2)     Status: None (Preliminary result)   Collection Time: 12/09/19  9:44 PM   Specimen: BLOOD LEFT ARM    Result Value Ref Range Status   Specimen Description   Final    BLOOD LEFT ARM Performed at [facility], [address] [place], [place] [address]    Special Requests   Final    BOTTLES DRAWN AEROBIC AND ANAEROBIC Blood Culture adequate volume Performed at [facility], [address] [place], [place] [address]    Culture   Final    NO GROWTH 1 DAY Performed at [facility], [place] [address] [place], [place] [address]    Report Status PENDING  Incomplete  Urine culture     Status: None   Collection Time: 12/09/19  9:44 PM   Specimen: In/Out Cath Urine  Result Value Ref Range Status   Specimen Description   Final    IN/OUT CATH URINE Performed at [facility], [address] [place], [place] [address]    Special Requests   Final    NONE Performed at [facility], [address] [place], [place] [address]    Culture   Final    NO GROWTH Performed at [facility], [place] [address], [place], [place] [address]    Report Status 12/11/2019 FINAL  Final  Blood Culture (routine x 2)     Status: None (Preliminary result)   Collection Time: 12/09/19 10:32 PM   Specimen: BLOOD LEFT WRIST  Result Value Ref Range Status   Specimen Description   Final    BLOOD LEFT WRIST Performed at [facility], [address] [place], [place] [address]    Special Requests   Final    BOTTLES DRAWN AEROBIC AND ANAEROBIC Blood Culture adequate volume Performed at [facility], [address], [place], [place] [address]    Culture   Final    NO GROWTH 1 DAY Performed at [facility], [place] [address] [place], [place] [address]    Report Status PENDING  Incomplete  SARS Coronavirus 2 by RT PCR (hospital order, performed in [facility] hospital lab) Nasopharyngeal Nasopharyngeal Swab  Status: None   Collection Time: 12/09/19 10:32 PM   Specimen: Nasopharyngeal Swab  Result Value Ref Range Status   SARS  Coronavirus 2 NEGATIVE NEGATIVE Final    Comment: (NOTE) SARS-CoV-2 target nucleic acids are NOT DETECTED.  The SARS-CoV-2 RNA is generally detectable in upper and lower respiratory specimens during the acute phase of infection. The lowest concentration of SARS-CoV-2 viral copies this assay can detect is 250 copies / mL. A negative result does not preclude SARS-CoV-2 infection and should not be used as the sole basis for treatment or other patient management decisions.  A negative result may occur with improper specimen collection / handling, submission of specimen other than nasopharyngeal swab, presence of viral mutation(s) within the areas targeted by this assay, and inadequate number of viral copies (<250 copies / mL). A negative result must be combined with clinical observations, patient history, and epidemiological information.  Fact Sheet for Patients:   StrictlyIdeas.no  Fact Sheet for Healthcare Providers: BankingDealers.co.za  This test is not yet approved or  cleared by the [place] FDA and has been authorized for detection and/or diagnosis of SARS-CoV-2 by FDA under an Emergency Use Authorization (EUA).  This EUA will remain in effect (meaning this test can be used) for the duration of the COVID-19 declaration under Section 564(b)(1) of the Act, 21 U.S.C. section 360bbb-3(b)(1), unless the authorization is terminated or revoked sooner.  Performed at [facility], [address] [place], [place] [address]   Urine culture     Status: None   Collection Time: 12/10/19  9:49 PM   Specimen: Urine, Clean Catch  Result Value Ref Range Status   Specimen Description   Final    URINE, CLEAN CATCH Performed at [facility], [place] [address] [place], [place] [address]    Special Requests   Final    NONE Performed at [facility], [place] [address] [place], [place] [address]     Culture   Final    NO GROWTH Performed at [facility], [place] [address], [place], [place] [address]    Report Status 12/12/2019 FINAL  Final  Culture, blood (routine x 2)     Status: None (Preliminary result)   Collection Time: 12/10/19 10:10 PM   Specimen: BLOOD LEFT HAND  Result Value Ref Range Status   Specimen Description   Final    BLOOD LEFT HAND Performed at [facility] [address] [place], [place] [address]    Special Requests   Final    BOTTLES DRAWN AEROBIC AND ANAEROBIC Blood Culture adequate volume Performed at [facility] [address], [place], [place] [address]    Culture   Final    NO GROWTH < 24 HOURS Performed at [facility] [address], [place], [place] [address]    Report Status PENDING  Incomplete      Radiology Studies: No results found.     LOS: 1 day   Lucee Brissett [employer] on [email or web address]  12/12/2019, 9:34 AM

## 2019-12-13 LAB — BASIC METABOLIC PANEL
Anion gap: 8 (ref 5–15)
BUN: 7 mg/dL (ref 6–20)
CO2: 25 mmol/L (ref 22–32)
Calcium: 8.4 mg/dL — ABNORMAL LOW (ref 8.9–10.3)
Chloride: 104 mmol/L (ref 98–111)
Creatinine, Ser: 0.55 mg/dL (ref 0.44–1.00)
GFR calc Af Amer: >60 mL/min (ref >60)
GFR calc non Af Amer: >60 mL/min (ref >60)
Glucose, Bld: 111 mg/dL — ABNORMAL HIGH (ref 70–99)
Potassium: 4.3 mmol/L (ref 3.5–5.1)
Sodium: 137 mmol/L (ref 135–145)

## 2019-12-13 LAB — CBC
HCT: 35.6 % — ABNORMAL LOW (ref 36.0–46.0)
Hemoglobin: 12 g/dL (ref 12.0–15.0)
MCH: 31.5 pg (ref 26.0–34.0)
MCHC: 33.7 g/dL (ref 30.0–36.0)
MCV: 93.4 fL (ref 80.0–100.0)
Platelets: 315 10*3/uL (ref 150–400)
RBC: 3.81 MIL/uL — ABNORMAL LOW (ref 3.87–5.11)
RDW: 12 % (ref 11.5–15.5)
WBC: 6.2 10*3/uL (ref 4.0–10.5)
nRBC: 0 % (ref 0.0–0.2)

## 2019-12-13 NOTE — Progress Notes (Signed)
Pt alert and oriented. Tolerating diet. D/C instructions given. Pt d/cd to home.

## 2019-12-13 NOTE — Discharge Summary (Signed)
[facility]  Physician Discharge Summary   Patient ID: Dana [name] MRN: [id number] DOB/AGE: 1960-11-22 59 y.o.  Admit date: 12/10/2019 Discharge date: 12/13/2019  PCP: Hali Marry, MD  DISCHARGE DIAGNOSES:  Acute pyelonephritis with sepsis present on admission Mild hyponatremia Gastric diverticulum Remote history of breast cancer  RECOMMENDATIONS FOR OUTPATIENT FOLLOW UP: 1. Outpatient follow-up with PCP in 1 week.   Home Health: None Equipment/Devices: None  CODE STATUS: Full code  DISCHARGE CONDITION: fair  Diet recommendation: As before  INITIAL HISTORY: 59 y.o.femalewith medical history significant ofbreast cancer approximately 11 years ago status post mastectomy with augmentation and chemo. No episodes of recurrence. She presented with signs and symptoms of UTI ongoing since 6-21. Symptoms initially started with urinary frequency, the next day developed dysuria, the next day it felt achy all over, the next day developed fever and chills which was essentially low-grade, the next day developed rigors with a fever of 102. States she went to an urgent care where she was given IM Rocephin and placed on Cipro. She filled her prescription for Cipro began taking that at home had increasing fevers up to 104 and more rigors and came to the [facility] on 626. At that time she was given IV cefepime and told to continue her Cipro.  However her symptoms persisted so she decided to come into the hospital.  She was hospitalized for further management.     HOSPITAL COURSE:   Acute pyelonephritis with sepsis, POA CT scan showed mild left hydronephrosis but no stone.   Patient was placed on ciprofloxacin as an outpatient but due to persistent fevers she had to be hospitalized.  She was also given a dose of cefepime at an outside practice. Started on ceftriaxone.  Her initial urine culture came back positive for E. coli.  Sensitivities reviewed.  Blood  cultures done in the urgent care as well as in the hospital have been negative so far. Patient did have fever with a temperature of 100.5 overnight.  She feels well this morning.  Has tolerated her diet.  Wants to go home. She only took 2 tablets of ciprofloxacin before she had to be admitted.  The E. coli is sensitive to ciprofloxacin.  I would not consider this a failure of treatment.  She did not have any adverse reaction to Cipro.  She may continue taking that medication at discharge.  She is agreeable with this plan.    Mild hyponatremia Likely due to hypovolemia.  Resolved.  Gastric diverticulum Incidentally noted on CT scan.  Outpatient evaluation.  Remote history of breast cancer Stable.  Patient is stable.  Low grade fever noted overnight.  Patient however feels well this morning.  Wishes to go home.  Her WBC is normal.  She is otherwise stable.  Reasonable to discharge today.   PERTINENT LABS:  The results of significant diagnostics from this hospitalization (including imaging, microbiology, ancillary and laboratory) are listed below for reference.    Microbiology: Recent Results (from the past 240 hour(s))  Urine culture     Status: Abnormal   Collection Time: 12/09/19  3:42 PM   Specimen: Urine  Result Value Ref Range Status   MICRO NUMBER: [id number]  Final   SPECIMEN QUALITY: Adequate  Final   Sample Source URINE  Final   STATUS: FINAL  Final   ISOLATE 1: Escherichia coli (A)  Final    Comment: Greater than 100,000 CFU/mL of Escherichia coli      Susceptibility  Escherichia coli - URINE CULTURE, REFLEX    AMOX/CLAVULANIC <=2 Sensitive     AMPICILLIN <=2 Sensitive     AMPICILLIN/SULBACTAM <=2 Sensitive     CEFAZOLIN* <=4 Not Reportable      * For infections other than uncomplicated UTIcaused by E. coli, K. pneumoniae or P. mirabilis:Cefazolin is resistant if MIC > or = 8 mcg/mL.(Distinguishing susceptible versus intermediatefor isolates with MIC < or = 4 mcg/mL  requiresadditional testing.)For uncomplicated UTI caused by E. coli,K. pneumoniae or P. mirabilis: Cefazolin issusceptible if MIC <32 mcg/mL and predictssusceptible to the oral agents cefaclor, cefdinir,cefpodoxime, cefprozil, cefuroxime, cephalexinand loracarbef.    CEFEPIME <=1 Sensitive     CEFTRIAXONE <=1 Sensitive     CIPROFLOXACIN <=0.25 Sensitive     LEVOFLOXACIN <=0.12 Sensitive     ERTAPENEM <=0.5 Sensitive     GENTAMICIN <=1 Sensitive     IMIPENEM <=0.25 Sensitive     NITROFURANTOIN <=16 Sensitive     PIP/TAZO <=4 Sensitive     TOBRAMYCIN <=1 Sensitive     TRIMETH/SULFA* <=20 Sensitive      * For infections other than uncomplicated UTIcaused by E. coli, K. pneumoniae or P. mirabilis:Cefazolin is resistant if MIC > or = 8 mcg/mL.(Distinguishing susceptible versus intermediatefor isolates with MIC < or = 4 mcg/mL requiresadditional testing.)For uncomplicated UTI caused by E. coli,K. pneumoniae or P. mirabilis: Cefazolin issusceptible if MIC <32 mcg/mL and predictssusceptible to the oral agents cefaclor, cefdinir,cefpodoxime, cefprozil, cefuroxime, cephalexinand loracarbef.Legend:S = Susceptible  I = IntermediateR = Resistant  NS = Not susceptible* = Not tested  NR = Not reported**NN = See antimicrobic comments  Blood Culture (routine x 2)     Status: None (Preliminary result)   Collection Time: 12/09/19  9:44 PM   Specimen: BLOOD LEFT ARM  Result Value Ref Range Status   Specimen Description   Final    BLOOD LEFT ARM Performed at [facility], [address] [place], [place] [address]    Special Requests   Final    BOTTLES DRAWN AEROBIC AND ANAEROBIC Blood Culture adequate volume Performed at [facility] [address] [place], [place] [address]    Culture   Final    NO GROWTH 3 DAYS Performed at [facility], [place] [address] [place], [place] [address]    Report Status PENDING  Incomplete  Urine culture     Status: None   Collection Time:  12/09/19  9:44 PM   Specimen: In/Out Cath Urine  Result Value Ref Range Status   Specimen Description   Final    IN/OUT CATH URINE Performed at [facility], [address] [place], [place] [address]    Special Requests   Final    NONE Performed at [facility], [address] [place], [place] [address]    Culture   Final    NO GROWTH Performed at [facility], [place] [address], [place], [place] [address]    Report Status 12/11/2019 FINAL  Final  Blood Culture (routine x 2)     Status: None (Preliminary result)   Collection Time: 12/09/19 10:32 PM   Specimen: BLOOD LEFT WRIST  Result Value Ref Range Status   Specimen Description   Final    BLOOD LEFT WRIST Performed at [facility], [address] [place], [place] [address]    Special Requests   Final    BOTTLES DRAWN AEROBIC AND ANAEROBIC Blood  Culture adequate volume Performed at [facility], [address] [place], [place] [address]    Culture   Final    NO GROWTH 3 DAYS Performed at [facility], [place] [address], [place], [place] [address]    Report Status PENDING  Incomplete  SARS Coronavirus 2 by RT PCR (hospital order, performed in [facility] hospital lab) Nasopharyngeal Nasopharyngeal Swab     Status: None   Collection Time: 12/09/19 10:32 PM   Specimen: Nasopharyngeal Swab  Result Value Ref Range Status   SARS Coronavirus 2 NEGATIVE NEGATIVE Final    Comment: (NOTE) SARS-CoV-2 target nucleic acids are NOT DETECTED.  The SARS-CoV-2 RNA is generally detectable in upper and lower respiratory specimens during the acute phase of infection. The lowest concentration of SARS-CoV-2 viral copies this assay can detect is 250 copies / mL. A negative result does not preclude SARS-CoV-2 infection and should not be used as the sole basis for treatment or other patient management decisions.  A negative result may occur with improper specimen collection / handling,  submission of specimen other than nasopharyngeal swab, presence of viral mutation(s) within the areas targeted by this assay, and inadequate number of viral copies (<250 copies / mL). A negative result must be combined with clinical observations, patient history, and epidemiological information.  Fact Sheet for Patients:   StrictlyIdeas.no  Fact Sheet for Healthcare Providers: BankingDealers.co.za  This test is not yet approved or  cleared by the [place] FDA and has been authorized for detection and/or diagnosis of SARS-CoV-2 by FDA under an Emergency Use Authorization (EUA).  This EUA will remain in effect (meaning this test can be used) for the duration of the COVID-19 declaration under Section 564(b)(1) of the Act, 21 U.S.C. section 360bbb-3(b)(1), unless the authorization is terminated or revoked sooner.  Performed at [facility], [address] [place], [place] [address]   Urine culture     Status: None   Collection Time: 12/10/19  9:49 PM   Specimen: Urine, Clean Catch  Result Value Ref Range Status   Specimen Description   Final    URINE, CLEAN CATCH Performed at [facility], [place] [address], [place], [place] [address]    Special Requests   Final    NONE Performed at [facility], [place] [address] [place], [place] [address]    Culture   Final    NO GROWTH Performed at [facility], [place] [address] [place], [place] [address]    Report Status 12/12/2019 FINAL  Final  Culture, blood (routine x 2)     Status: None (Preliminary result)   Collection Time: 12/10/19 10:10 PM   Specimen: BLOOD LEFT HAND  Result Value Ref Range Status   Specimen Description   Final    BLOOD LEFT HAND Performed at [place] [address] [place], [place] [address]    Special Requests   Final    BOTTLES DRAWN AEROBIC AND ANAEROBIC Blood Culture adequate  volume Performed at [place] [address] [place], [place] [address]    Culture   Final    NO GROWTH 3 DAYS Performed at [facility], [place] [address] [place], [place] [address]    Report Status PENDING  Incomplete  Culture, blood (routine x 2)     Status: None (Preliminary result)   Collection Time: 12/11/19  8:41 AM   Specimen: BLOOD  Result  Value Ref Range Status   Specimen Description   Final    BLOOD BLOOD LEFT FOREARM Performed at [facility] [address], [place], [place] [address]    Special Requests   Final    BOTTLES DRAWN AEROBIC AND ANAEROBIC Blood Culture adequate volume Performed at [facility] [address] [place], [place] [address]    Culture   Final    NO GROWTH 2 DAYS Performed at [place] [address] [place],  [address]    Report Status PENDING  Incomplete     Labs:  COVID-19 Labs  No results for input(s): DDIMER, FERRITIN, LDH, CRP in the last 72 hours.  Lab Results  Component Value Date   Fisher NEGATIVE 12/09/2019      Basic Metabolic Panel: Recent Labs  Lab 12/09/19 2144 12/10/19 1959 12/12/19 0536 12/13/19 0506  NA 133* 133* 136 137  K 3.7 3.7 4.4 4.3  CL 96* 100 103 104  CO2 25 24 25 25   GLUCOSE 153* 122* 107* 111*  BUN 16 9 9 7   CREATININE 0.69 0.66 0.58 0.55  CALCIUM 8.8* 8.5* 8.4* 8.4*   Liver Function Tests: Recent Labs  Lab 12/09/19 2144 12/10/19 1959 12/12/19 0536  AST 18 15 14*  ALT 15 13 12   ALKPHOS 52 46 38  BILITOT 1.0 0.4 0.4  PROT 7.6 7.0 6.9  ALBUMIN 4.1 3.7 3.2*   CBC: Recent Labs  Lab 12/09/19 2144 12/10/19 1959 12/12/19 0536 12/13/19 0506  WBC 10.3 9.4 8.2 6.2  NEUTROABS 8.8* 7.2  --   --   HGB 13.4 12.2 12.1 12.0  HCT 41.0 36.5 36.1 35.6*  MCV 95.6 96.3 93.3 93.4  PLT 283 251 255 315     IMAGING STUDIES DG Chest 2 View  Result Date: 12/09/2019 CLINICAL DATA:  59 year old female with  fever. EXAM: CHEST - 2 VIEW COMPARISON:  Chest radiograph dated 08/30/2008. FINDINGS: The heart size and mediastinal contours are within normal limits. Both lungs are clear. The visualized skeletal structures are unremarkable. IMPRESSION: No active cardiopulmonary disease. Electronically Signed   By: Anner Crete M.D.   On: 12/09/2019 22:13   CT ABDOMEN PELVIS W CONTRAST  Result Date: 12/09/2019 CLINICAL DATA:  Dysuria. EXAM: CT ABDOMEN AND PELVIS WITH CONTRAST TECHNIQUE: Multidetector CT imaging of the abdomen and pelvis was performed using the standard protocol following bolus administration of intravenous contrast. CONTRAST:  155mL OMNIPAQUE IOHEXOL 300 MG/ML  SOLN COMPARISON:  None. FINDINGS: Lower chest: No acute abnormality. Bilateral breast implants are seen. Hepatobiliary: No focal liver abnormality is seen. No gallstones, gallbladder wall thickening, or biliary dilatation. Pancreas: Unremarkable. No pancreatic ductal dilatation or surrounding inflammatory changes. Spleen: Normal in size without focal abnormality. Adrenals/Urinary Tract: Adrenal glands are unremarkable. Kidneys are normal in size. An 8 mm cyst is seen within the anterior aspect of the mid left kidney. There is mild left-sided hydronephrosis. Bladder is unremarkable. Stomach/Bowel: A 5.3 cm x 5.6 cm gastric diverticulum is seen along the posterior aspect of the gastric fundus. Appendix appears normal. No evidence of bowel wall thickening, distention, or inflammatory changes. Vascular/Lymphatic: No significant vascular findings are present. No enlarged abdominal or pelvic lymph nodes. Reproductive: Uterus and bilateral adnexa are unremarkable. Other: No abdominal wall hernia or abnormality. No abdominopelvic ascites. Musculoskeletal: No acute or significant osseous findings. IMPRESSION: 1. 5.3 cm x 5.6 cm gastric diverticulum. 2. Mild left-sided hydronephrosis. 3. Bilateral breast implants. Electronically Signed   By: Hoover Browns  Houston  M.D.   On: 12/09/2019 23:33    DISCHARGE EXAMINATION: Vitals:   12/12/19 1740 12/12/19 2052 12/13/19 0156 12/13/19 0652  BP: 118/77 126/83 110/69 113/75  Pulse: 67 76 63 62  Resp: 16 18 16 18   Temp: 97.8 F (36.6 C) (!) 100.5 F (38.1 C) 98.4 F (36.9 C) 98.2 F (36.8 C)  TempSrc: Oral Oral Oral Oral  SpO2: 100% 100% 97% 100%  Weight:      Height:       General appearance: Awake alert.  In no distress Resp: Clear to auscultation bilaterally.  Normal effort Cardio: S1-S2 is normal regular.  No S3-S4.  No rubs murmurs or bruit GI: Abdomen is soft.  Nontender nondistended.  Bowel sounds are present normal.  No masses organomegaly     DISPOSITION: Home  Discharge Instructions    Call MD for:  difficulty breathing, headache or visual disturbances   Complete by: As directed    Call MD for:  extreme fatigue   Complete by: As directed    Call MD for:  persistant dizziness or light-headedness   Complete by: As directed    Call MD for:  persistant nausea and vomiting   Complete by: As directed    Call MD for:  severe uncontrolled pain   Complete by: As directed    Discharge instructions   Complete by: As directed    Please take your medications as prescribed.  Seek attention immediately if you develop high fevers greater than 101 F not getting relieved with Tylenol.  Seek attention also if your symptoms were to get worse or if you were to develop nausea and vomiting.  You were cared for by a hospitalist during your hospital stay. If you have any questions about your discharge medications or the care you received while you were in the hospital after you are discharged, you can call the unit and asked to speak with the hospitalist on call if the hospitalist that took care of you is not available. Once you are discharged, your primary care physician will handle any further medical issues. Please note that NO REFILLS for any discharge medications will be authorized once you are  discharged, as it is imperative that you return to your primary care physician (or establish a relationship with a primary care physician if you do not have one) for your aftercare needs so that they can reassess your need for medications and monitor your lab values. If you do not have a primary care physician, you can call [phone] for a physician referral.   Increase activity slowly   Complete by: As directed         Allergies as of 12/13/2019   No Known Allergies     Medication List    TAKE these medications   acetaminophen 500 MG tablet Commonly known as: TYLENOL Take 500 mg by mouth every 6 (six) hours as needed for fever.   CALCIUM 1200 PO Take 1 tablet by mouth daily.   ciprofloxacin 500 MG tablet Commonly known as: CIPRO Take one tab PO Q12hr for 7 days   Fish Oil 1000 MG Caps Take 1 capsule by mouth daily.   FLUoxetine 10 MG capsule Commonly known as: PROZAC Take 1 capsule (10 mg total) by mouth daily. Dx:f32.9   ibuprofen 200 MG tablet Commonly known as: ADVIL Take 200 mg by mouth every 6 (six) hours as needed for fever.   MULTIVITAMIN ADULT PO Take 1 tablet by mouth daily.  Follow-up Information    Hali Marry, MD. Schedule an appointment as soon as possible for a visit in 1 week(s).   Specialty: Family Medicine Contact information: [address] [place] [address] [phone]               TOTAL DISCHARGE TIME: 69 minutes  [facility] Pager on [email or web address]  12/13/2019, 12:19 PM

## 2019-12-13 NOTE — Discharge Instructions (Signed)
Pyelonephritis, Adult ° °Pyelonephritis is an infection that occurs in the kidney. The kidneys are organs that help clean the blood by moving waste out of the blood and into the pee (urine). This infection can happen quickly, or it can last for a long time. In most cases, it clears up with treatment and does not cause other problems. °What are the causes? °This condition may be caused by: °· Germs (bacteria) going from the bladder up to the kidney. This may happen after having a bladder infection. °· Germs going from the blood to the kidney. °What increases the risk? °This condition is more likely to develop in: °· Pregnant women. °· Older people. °· People who have any of these conditions: °? Diabetes. °? Inflammation of the prostate gland (prostatitis), in males. °? Kidney stones or bladder stones. °? Other problems with the kidney or the parts of your body that carry pee from the kidneys to the bladder (ureters). °? Cancer. °· People who have a small, thin tube (catheter) placed in the bladder. °· People who are sexually active. °· Women who use a medicine that kills sperm (spermicide) to prevent pregnancy. °· People who have had a prior urinary tract infection (UTI). °What are the signs or symptoms? °Symptoms of this condition include: °· Peeing often. °· A strong urge to pee right away. °· Burning or stinging when peeing. °· Belly pain. °· Back pain. °· Pain in the side (flank area). °· Fever or chills. °· Blood in the pee, or dark pee. °· Feeling sick to your stomach (nauseous) or throwing up (vomiting). °How is this treated? °This condition may be treated by: °· Taking antibiotic medicines by mouth (orally). °· Drinking enough fluids. °If the infection is bad, you may need to stay in the hospital. You may be given antibiotics and fluids that are put directly into a vein through an IV tube. °In some cases, other treatments may be needed. °Follow these instructions at home: °Medicines °· Take your antibiotic  medicine as told by your doctor. Do not stop taking the antibiotic even if you start to feel better. °· Take over-the-counter and prescription medicines only as told by your doctor. °General instructions ° °· Drink enough fluid to keep your pee pale yellow. °· Avoid caffeine, tea, and carbonated drinks. °· Pee (urinate) often. Avoid holding in pee for long periods of time. °· Pee before and after sex. °· After pooping (having a bowel movement), women should wipe from front to back. Use each tissue only once. °· Keep all follow-up visits as told by your doctor. This is important. °Contact a doctor if: °· You do not feel better after 2 days. °· Your symptoms get worse. °· You have a fever. °Get help right away if: °· You cannot take your medicine or drink fluids as told. °· You have chills and shaking. °· You throw up. °· You have very bad pain in your side or back. °· You feel very weak or you pass out (faint). °Summary °· Pyelonephritis is an infection that occurs in the kidney. °· In most cases, this infection clears up with treatment and does not cause other problems. °· Take your antibiotic medicine as told by your doctor. Do not stop taking the antibiotic even if you start to feel better. °· Drink enough fluid to keep your pee pale yellow. °This information is not intended to replace advice given to you by your health care provider. Make sure you discuss any questions you have with   your health care provider. Document Revised: 04/06/2018 Document Reviewed: 04/06/2018 Elsevier Patient Education  2020 [employer]  

## 2019-12-15 ENCOUNTER — Telehealth: Payer: Self-pay | Admitting: Family Medicine

## 2019-12-15 LAB — CULTURE, BLOOD (ROUTINE X 2)
Culture: NO GROWTH
Culture: NO GROWTH
Culture: NO GROWTH
Special Requests: ADEQUATE
Special Requests: ADEQUATE
Special Requests: ADEQUATE

## 2019-12-15 NOTE — Telephone Encounter (Signed)
Patient was seen at [facility] long for kidney infection. Needs a hospital follow next week. Acute only. Please advise.

## 2019-12-16 LAB — CULTURE, BLOOD (ROUTINE X 2)
Culture: NO GROWTH
Special Requests: ADEQUATE

## 2019-12-16 NOTE — Telephone Encounter (Signed)
Fine to put in acute

## 2019-12-16 NOTE — Telephone Encounter (Signed)
Pt has been scheduled with Dr. Madilyn Fireman on 12/20/19 at 1130 am.

## 2019-12-20 ENCOUNTER — Encounter: Payer: Self-pay | Admitting: Family Medicine

## 2019-12-20 ENCOUNTER — Ambulatory Visit (INDEPENDENT_AMBULATORY_CARE_PROVIDER_SITE_OTHER): Payer: BC Managed Care – PPO | Admitting: Family Medicine

## 2019-12-20 VITALS — BP 128/68 | HR 60 | Ht 68.0 in | Wt 147.0 lb

## 2019-12-20 DIAGNOSIS — N10 Acute tubulo-interstitial nephritis: Secondary | ICD-10-CM | POA: Diagnosis not present

## 2019-12-20 LAB — POCT URINALYSIS DIP (CLINITEK)
Bilirubin, UA: NEGATIVE
Blood, UA: NEGATIVE
Glucose, UA: NEGATIVE mg/dL
Ketones, POC UA: NEGATIVE mg/dL
Nitrite, UA: NEGATIVE
POC PROTEIN,UA: NEGATIVE
Spec Grav, UA: 1.015 (ref 1.010–1.025)
Urobilinogen, UA: 0.2 E.U./dL
pH, UA: 7 (ref 5.0–8.0)

## 2019-12-20 NOTE — Progress Notes (Signed)
Established Patient Office Visit  Subjective:  Patient ID: Dana [name], female    DOB: [birth year]  Age: 59 y.o. MRN: [id number]  CC:  Chief Complaint  Patient presents with  . Hospitalization Follow-up    kidney infection    HPI Dana [name] presents for hospital follow-up at [facility].  She was admitted on June 26 and discharged home on June 29 for acute pyelonephritis with sepsis.  She was initially evaluated at urgent care on June 25 and given IM Rocephin and started on oral Cipro she continued to have high fevers and so went back and was given IV cefepime and told to continue the Cipro.  She was worsening so went to the emergency department on June 26.  She also had mild hyponatremia.  CT scan showed some mild left hydronephrosis.  Was discharged home to complete the ciprofloxacin.  She has completed the antibiotic and is feeling much better all her symptoms have completely resolved.  Says the last time she had a UTI was about 35 years ago.  So this is not a recurring or frequent event for her.  Past Medical History:  Diagnosis Date  . Breast calcification, right 07/03/2011  . Cancer ([place])   . History of right breast cancer     Past Surgical History:  Procedure Laterality Date  . BREAST LUMPECTOMY    . BREAST RECONSTRUCTION    . MASTECTOMY, RADICAL  2010   right     Family History  Problem Relation Age of Onset  . Esophageal cancer Mother   . Hypertension Father   . Coronary artery disease Father 71  . Alzheimer's disease Father     Social History   Socioeconomic History  . Marital status: Married    Spouse name: Merry Proud  . Number of children: 2  . Years of education: Not on file  . Highest education level: Not on file  Occupational History  . Occupation: works for [employer]: [place]  Tobacco Use  . Smoking status: Never Smoker  . Smokeless tobacco: Never Used  Substance and Sexual Activity  . Alcohol use: Yes    Alcohol/week:  3.0 - 4.0 standard drinks    Types: 3 - 4 Glasses of wine per week  . Drug use: No  . Sexual activity: Yes    Partners: Male  Other Topics Concern  . Not on file  Social History Narrative   Some exercise. 2-3 caffeine drinks per day.    Social Determinants of Health   Financial Resource Strain:   . Difficulty of Paying Living Expenses:   Food Insecurity:   . Worried About Charity fundraiser in the Last Year:   . Arboriculturist in the Last Year:   Transportation Needs:   . Film/video editor (Medical):   Marland Kitchen Lack of Transportation (Non-Medical):   Physical Activity:   . Days of Exercise per Week:   . Minutes of Exercise per Session:   Stress:   . Feeling of Stress :   Social Connections:   . Frequency of Communication with Friends and Family:   . Frequency of Social Gatherings with Friends and Family:   . Attends Religious Services:   . Active Member of Clubs or Organizations:   . Attends Archivist Meetings:   Marland Kitchen Marital Status:   Intimate Partner Violence:   . Fear of Current or Ex-Partner:   . Emotionally Abused:   Marland Kitchen Physically  Abused:   . Sexually Abused:     Outpatient Medications Prior to Visit  Medication Sig Dispense Refill  . Calcium Carbonate-Vit D-Min (CALCIUM 1200 PO) Take 1 tablet by mouth daily.    Marland Kitchen FLUoxetine (PROZAC) 10 MG capsule Take 1 capsule (10 mg total) by mouth daily. Dx:f32.9 90 capsule 3  . Multiple Vitamins-Minerals (MULTIVITAMIN ADULT PO) Take 1 tablet by mouth daily.    . Omega-3 Fatty Acids (FISH OIL) 1000 MG CAPS Take 1 capsule by mouth daily.    Marland Kitchen acetaminophen (TYLENOL) 500 MG tablet Take 500 mg by mouth every 6 (six) hours as needed for fever.    . ciprofloxacin (CIPRO) 500 MG tablet Take one tab PO Q12hr for 7 days 14 tablet 0  . ibuprofen (ADVIL) 200 MG tablet Take 200 mg by mouth every 6 (six) hours as needed for fever.     No facility-administered medications prior to visit.    No Known Allergies  ROS Review of  Systems    Objective:    Physical Exam Constitutional:      Appearance: She is well-developed.  HENT:     Head: Normocephalic and atraumatic.  Cardiovascular:     Rate and Rhythm: Normal rate and regular rhythm.     Heart sounds: Normal heart sounds.  Pulmonary:     Effort: Pulmonary effort is normal.     Breath sounds: Normal breath sounds.  Musculoskeletal:     Comments: No CVA tenderness.  Skin:    General: Skin is warm and dry.  Neurological:     Mental Status: She is alert and oriented to person, place, and time.  Psychiatric:        Behavior: Behavior normal.     BP 128/68   Pulse 60   Ht 5\' 8"  (1.727 m)   Wt 147 lb (66.7 kg)   SpO2 100%   BMI 22.35 kg/m  Wt Readings from Last 3 Encounters:  12/20/19 147 lb (66.7 kg)  12/10/19 145 lb (65.8 kg)  12/09/19 147 lb 14.9 oz (67.1 kg)     There are no preventive care reminders to display for this patient.  There are no preventive care reminders to display for this patient.  Lab Results  Component Value Date   TSH 1.076 07/10/2011   Lab Results  Component Value Date   WBC 6.2 12/13/2019   HGB 12.0 12/13/2019   HCT 35.6 (L) 12/13/2019   MCV 93.4 12/13/2019   PLT 315 12/13/2019   Lab Results  Component Value Date   NA 137 12/13/2019   K 4.3 12/13/2019   CHLORIDE 102 09/15/2013   CO2 25 12/13/2019   GLUCOSE 111 (H) 12/13/2019   BUN 7 12/13/2019   CREATININE 0.55 12/13/2019   BILITOT 0.4 12/12/2019   ALKPHOS 38 12/12/2019   AST 14 (L) 12/12/2019   ALT 12 12/12/2019   PROT 6.9 12/12/2019   ALBUMIN 3.2 (L) 12/12/2019   CALCIUM 8.4 (L) 12/13/2019   ANIONGAP 8 12/13/2019   Lab Results  Component Value Date   CHOL 278 (H) 04/04/2019   Lab Results  Component Value Date   HDL 86 04/04/2019   Lab Results  Component Value Date   LDLCALC 176 (H) 04/04/2019   Lab Results  Component Value Date   TRIG 64 04/04/2019   Lab Results  Component Value Date   CHOLHDL 3.2 04/04/2019   Lab Results   Component Value Date   HGBA1C 5.6 04/04/2019  Assessment & Plan:   Problem List Items Addressed This Visit      Genitourinary   Acute pyelonephritis - Primary   Relevant Orders   POCT URINALYSIS DIP (CLINITEK) (Completed)     Acute pyelonephritis-symptoms have completely resolved she has completed her antibiotic.  We did repeat UA and culture today just to confirm that we have completely cleared the infection in case she gets recurrence of any symptoms.  Discussed drinking plenty of water and emptying bladder regularly to reduce recurrence of UTI.  Also encouraged her to seek care promptly if she starts develop any new onset symptoms.  No orders of the defined types were placed in this encounter.   Follow-up: Return if symptoms worsen or fail to improve.    Beatrice Lecher, MD

## 2019-12-21 ENCOUNTER — Telehealth: Payer: Self-pay | Admitting: *Deleted

## 2019-12-21 DIAGNOSIS — N10 Acute tubulo-interstitial nephritis: Secondary | ICD-10-CM

## 2019-12-21 NOTE — Telephone Encounter (Signed)
Called and asked pt to come in and give a sample for a Ucx. She will come by tomorrow. Order faxed to lab.

## 2019-12-22 ENCOUNTER — Inpatient Hospital Stay: Payer: BC Managed Care – PPO | Admitting: Family Medicine

## 2020-02-14 ENCOUNTER — Other Ambulatory Visit: Payer: Self-pay | Admitting: Family Medicine

## 2020-04-18 ENCOUNTER — Ambulatory Visit: Payer: BC Managed Care – PPO | Admitting: Physician Assistant

## 2020-05-14 ENCOUNTER — Telehealth: Payer: Self-pay | Admitting: Family Medicine

## 2020-05-14 NOTE — Telephone Encounter (Signed)
Patient's husband dropped off form to be filled out for patient today, 05/14/2020, just before lunch, wasn't sure if patient needed an appointment for this, was last seen around June/July with PCP. Form placed in provider box. If appointment needs to be scheduled, just let me know. AM

## 2020-05-15 NOTE — Telephone Encounter (Signed)
Please call pt to schedule an appointment for form

## 2020-05-16 NOTE — Telephone Encounter (Signed)
Patient needs a CPE, husband is aware.

## 2020-05-16 NOTE — Telephone Encounter (Signed)
Dana [name] has scheduled patient appt for Physical. AM

## 2020-05-17 ENCOUNTER — Other Ambulatory Visit: Payer: Self-pay

## 2020-05-17 ENCOUNTER — Ambulatory Visit (INDEPENDENT_AMBULATORY_CARE_PROVIDER_SITE_OTHER): Payer: BC Managed Care – PPO | Admitting: Nurse Practitioner

## 2020-05-17 ENCOUNTER — Encounter: Payer: Self-pay | Admitting: Nurse Practitioner

## 2020-05-17 VITALS — BP 118/82 | HR 61 | Temp 98.6°F | Ht 67.5 in | Wt 148.7 lb

## 2020-05-17 DIAGNOSIS — Z13220 Encounter for screening for lipoid disorders: Secondary | ICD-10-CM

## 2020-05-17 DIAGNOSIS — Z131 Encounter for screening for diabetes mellitus: Secondary | ICD-10-CM

## 2020-05-17 DIAGNOSIS — C50911 Malignant neoplasm of unspecified site of right female breast: Secondary | ICD-10-CM

## 2020-05-17 DIAGNOSIS — Z1231 Encounter for screening mammogram for malignant neoplasm of breast: Secondary | ICD-10-CM | POA: Diagnosis not present

## 2020-05-17 DIAGNOSIS — Z0000 Encounter for general adult medical examination without abnormal findings: Secondary | ICD-10-CM

## 2020-05-17 DIAGNOSIS — F325 Major depressive disorder, single episode, in full remission: Secondary | ICD-10-CM

## 2020-05-17 DIAGNOSIS — Z853 Personal history of malignant neoplasm of breast: Secondary | ICD-10-CM

## 2020-05-17 HISTORY — DX: Malignant neoplasm of unspecified site of right female breast: C50.911

## 2020-05-17 NOTE — Assessment & Plan Note (Signed)
Continue Prozac as prescribed.  No concerns today or changes to plan of care.

## 2020-05-17 NOTE — Assessment & Plan Note (Signed)
S/P mastectomy and implant.  Orders for mammogram placed today.  No changes or concerns.

## 2020-05-17 NOTE — Patient Instructions (Signed)
Health Maintenance, Female Adopting a healthy lifestyle and getting preventive care are important in promoting health and wellness. Ask your health care provider about:  The right schedule for you to have regular tests and exams.  Things you can do on your own to prevent diseases and keep yourself healthy. What should I know about diet, weight, and exercise? Eat a healthy diet   Eat a diet that includes plenty of vegetables, fruits, low-fat dairy products, and lean protein.  Do not eat a lot of foods that are high in solid fats, added sugars, or sodium. Maintain a healthy weight Body mass index (BMI) is used to identify weight problems. It estimates body fat based on height and weight. Your health care provider can help determine your BMI and help you achieve or maintain a healthy weight. Get regular exercise Get regular exercise. This is one of the most important things you can do for your health. Most adults should:  Exercise for at least 150 minutes each week. The exercise should increase your heart rate and make you sweat (moderate-intensity exercise).  Do strengthening exercises at least twice a week. This is in addition to the moderate-intensity exercise.  Spend less time sitting. Even light physical activity can be beneficial. Watch cholesterol and blood lipids Have your blood tested for lipids and cholesterol at 60 years of age, then have this test every 5 years. Have your cholesterol levels checked more often if:  Your lipid or cholesterol levels are high.  You are older than 60 years of age.  You are at high risk for heart disease. What should I know about cancer screening? Depending on your health history and family history, you may need to have cancer screening at various ages. This may include screening for:  Breast cancer.  Cervical cancer.  Colorectal cancer.  Skin cancer.  Lung cancer. What should I know about heart disease, diabetes, and high blood  pressure? Blood pressure and heart disease  High blood pressure causes heart disease and increases the risk of stroke. This is more likely to develop in people who have high blood pressure readings, are of African descent, or are overweight.  Have your blood pressure checked: ? Every 3-5 years if you are 54-9 years of age. ? Every year if you are 69 years old or older. Diabetes Have regular diabetes screenings. This checks your fasting blood sugar level. Have the screening done:  Once every three years after age 36 if you are at a normal weight and have a low risk for diabetes.  More often and at a younger age if you are overweight or have a high risk for diabetes. What should I know about preventing infection? Hepatitis B If you have a higher risk for hepatitis B, you should be screened for this virus. Talk with your health care provider to find out if you are at risk for hepatitis B infection. Hepatitis C Testing is recommended for:  Everyone born from [birth year] through 1966.  Anyone with known risk factors for hepatitis C. Sexually transmitted infections (STIs)  Get screened for STIs, including gonorrhea and chlamydia, if: ? You are sexually active and are younger than 60 years of age. ? You are older than 60 years of age and your health care provider tells you that you are at risk for this type of infection. ? Your sexual activity has changed since you were last screened, and you are at increased risk for chlamydia or gonorrhea. Ask your health care provider  if you are at risk.  Ask your health care provider about whether you are at high risk for HIV. Your health care provider may recommend a prescription medicine to help prevent HIV infection. If you choose to take medicine to prevent HIV, you should first get tested for HIV. You should then be tested every 3 months for as long as you are taking the medicine. Pregnancy  If you are about to stop having your period (premenopausal) and  you may become pregnant, seek counseling before you get pregnant.  Take 400 to 800 micrograms (mcg) of folic acid every day if you become pregnant.  Ask for birth control (contraception) if you want to prevent pregnancy. Osteoporosis and menopause Osteoporosis is a disease in which the bones lose minerals and strength with aging. This can result in bone fractures. If you are 36 years old or older, or if you are at risk for osteoporosis and fractures, ask your health care provider if you should:  Be screened for bone loss.  Take a calcium or vitamin D supplement to lower your risk of fractures.  Be given hormone replacement therapy (HRT) to treat symptoms of menopause. Follow these instructions at home: Lifestyle  Do not use any products that contain nicotine or tobacco, such as cigarettes, e-cigarettes, and chewing tobacco. If you need help quitting, ask your health care provider.  Do not use street drugs.  Do not share needles.  Ask your health care provider for help if you need support or information about quitting drugs. Alcohol use  Do not drink alcohol if: ? Your health care provider tells you not to drink. ? You are pregnant, may be pregnant, or are planning to become pregnant.  If you drink alcohol: ? Limit how much you use to 0-1 drink a day. ? Limit intake if you are breastfeeding.  Be aware of how much alcohol is in your drink. In the [place] one drink equals one 12 oz bottle of beer (355 mL), one 5 oz glass of wine (148 mL), or one 1 oz glass of hard liquor (44 mL). General instructions  Schedule regular health, dental, and eye exams.  Stay current with your vaccines.  Tell your health care provider if: ? You often feel depressed. ? You have ever been abused or do not feel safe at home. Summary  Adopting a healthy lifestyle and getting preventive care are important in promoting health and wellness.  Follow your health care provider's instructions about healthy  diet, exercising, and getting tested or screened for diseases.  Follow your health care provider's instructions on monitoring your cholesterol and blood pressure. This information is not intended to replace advice given to you by your health care provider. Make sure you discuss any questions you have with your health care provider. Document Revised: 05/26/2018 Document Reviewed: 05/26/2018 Elsevier Patient Education  [place] and Cholesterol Restricted Eating Plan Getting too much fat and cholesterol in your diet may cause health problems. Choosing the right foods helps keep your fat and cholesterol at normal levels. This can keep you from getting certain diseases. Your doctor may recommend an eating plan that includes:  Total fat: ______% or less of total calories a day.  Saturated fat: ______% or less of total calories a day.  Cholesterol: less than _________mg a day.  Fiber: ______g a day. What are tips for following this plan? Meal planning  At meals, divide your plate into four equal parts: ? Fill one-half of  your plate with vegetables and green salads. ? Fill one-fourth of your plate with whole grains. ? Fill one-fourth of your plate with low-fat (lean) protein foods.  Eat fish that is high in omega-3 fats at least two times a week. This includes mackerel, tuna, sardines, and salmon.  Eat foods that are high in fiber, such as whole grains, beans, apples, broccoli, carrots, peas, and barley. General tips   Work with your doctor to lose weight if you need to.  Avoid: ? Foods with added sugar. ? Fried foods. ? Foods with partially hydrogenated oils.  Limit alcohol intake to no more than 1 drink a day for nonpregnant women and 2 drinks a day for men. One drink equals 12 oz of beer, 5 oz of wine, or 1 oz of hard liquor. Reading food labels  Check food labels for: ? Trans fats. ? Partially hydrogenated oils. ? Saturated fat (g) in each  serving. ? Cholesterol (mg) in each serving. ? Fiber (g) in each serving.  Choose foods with healthy fats, such as: ? Monounsaturated fats. ? Polyunsaturated fats. ? Omega-3 fats.  Choose grain products that have whole grains. Look for the word "whole" as the first word in the ingredient list. Cooking  Cook foods using low-fat methods. These include baking, boiling, grilling, and broiling.  Eat more home-cooked foods. Eat at restaurants and buffets less often.  Avoid cooking using saturated fats, such as butter, cream, palm oil, palm kernel oil, and coconut oil. Recommended foods  Fruits  All fresh, canned (in natural juice), or frozen fruits. Vegetables  Fresh or frozen vegetables (raw, steamed, roasted, or grilled). Green salads. Grains  Whole grains, such as whole wheat or whole grain breads, crackers, cereals, and pasta. Unsweetened oatmeal, bulgur, barley, quinoa, or brown rice. Corn or whole wheat flour tortillas. Meats and other protein foods  Ground beef (85% or leaner), grass-fed beef, or beef trimmed of fat. Skinless chicken or [place]. Ground chicken or [place]. Pork trimmed of fat. All fish and seafood. Egg whites. Dried beans, peas, or lentils. Unsalted nuts or seeds. Unsalted canned beans. Nut butters without added sugar or oil. Dairy  Low-fat or nonfat dairy products, such as skim or 1% milk, 2% or reduced-fat cheeses, low-fat and fat-free ricotta or cottage cheese, or plain low-fat and nonfat yogurt. Fats and oils  Tub margarine without trans fats. Light or reduced-fat mayonnaise and salad dressings. Avocado. Olive, canola, sesame, or safflower oils. The items listed above may not be a complete list of foods and beverages you can eat. Contact a dietitian for more information. Foods to avoid Fruits  Canned fruit in heavy syrup. Fruit in cream or butter sauce. Fried fruit. Vegetables  Vegetables cooked in cheese, cream, or butter sauce. Fried  vegetables. Grains  White bread. White pasta. White rice. Cornbread. Bagels, pastries, and croissants. Crackers and snack foods that contain trans fat and hydrogenated oils. Meats and other protein foods  Fatty cuts of meat. Ribs, chicken wings, bacon, sausage, bologna, salami, chitterlings, fatback, hot dogs, bratwurst, and packaged lunch meats. Liver and organ meats. Whole eggs and egg yolks. Chicken and [place] with skin. Fried meat. Dairy  Whole or 2% milk, cream, half-and-half, and cream cheese. Whole milk cheeses. Whole-fat or sweetened yogurt. Full-fat cheeses. Nondairy creamers and whipped toppings. Processed cheese, cheese spreads, and cheese curds. Beverages  Alcohol. Sugar-sweetened drinks such as sodas, lemonade, and fruit drinks. Fats and oils  Butter, stick margarine, lard, shortening, ghee, or bacon fat. Coconut, palm kernel,  and palm oils. Sweets and desserts  Corn syrup, sugars, honey, and molasses. Candy. Jam and jelly. Syrup. Sweetened cereals. Cookies, pies, cakes, donuts, muffins, and ice cream. The items listed above may not be a complete list of foods and beverages you should avoid. Contact a dietitian for more information. Summary  Choosing the right foods helps keep your fat and cholesterol at normal levels. This can keep you from getting certain diseases.  At meals, fill one-half of your plate with vegetables and green salads.  Eat high-fiber foods, like whole grains, beans, apples, carrots, peas, and barley.  Limit added sugar, saturated fats, alcohol, and fried foods. This information is not intended to replace advice given to you by your health care provider. Make sure you discuss any questions you have with your health care provider. Document Revised: 02/03/2018 Document Reviewed: 02/17/2017 Elsevier Patient Education  [place] [facility]) Exercise Recommendation  Being physically active is important to prevent heart  disease and stroke, the nation's No. 1and No. 5killers. To improve overall cardiovascular health, we suggest at least 150 minutes per week of moderate exercise or 75 minutes per week of vigorous exercise (or a combination of moderate and vigorous activity). Thirty minutes a day, five times a week is an easy goal to remember. You will also experience benefits even if you divide your time into two or three segments of 10 to 15 minutes per day.  For people who would benefit from lowering their blood pressure or cholesterol, we recommend 40 minutes of aerobic exercise of moderate to vigorous intensity three to four times a week to lower the risk for heart attack and stroke.  Physical activity is anything that makes you move your body and burn calories.  This includes things like climbing stairs or playing sports. Aerobic exercises benefit your heart, and include walking, jogging, swimming or biking. Strength and stretching exercises are best for overall stamina and flexibility.  The simplest, positive change you can make to effectively improve your heart health is to start walking. It's enjoyable, free, easy, social and great exercise. A walking program is flexible and boasts high success rates because people can stick with it. It's easy for walking to become a regular and satisfying part of life.   For Overall Cardiovascular Health:  At least 30 minutes of moderate-intensity aerobic activity at least 5 days per week for a total of 150  OR   At least 25 minutes of vigorous aerobic activity at least 3 days per week for a total of 75 minutes; or a combination of moderate- and vigorous-intensity aerobic activity  AND   Moderate- to high-intensity muscle-strengthening activity at least 2 days per week for additional health benefits.  For Lowering Blood Pressure and Cholesterol  An average 40 minutes of moderate- to vigorous-intensity aerobic activity 3 or 4 times per week  What if I can't make it  to the time goal? Something is always better than nothing! And everyone has to start somewhere. Even if you've been sedentary for years, today is the day you can begin to make healthy changes in your life. If you don't think you'll make it for 30 or 40 minutes, set a reachable goal for today. You can work up toward your overall goal by increasing your time as you get stronger. Don't let all-or-nothing thinking rob you of doing what you can every day.  Source:http://www.heart.org

## 2020-05-17 NOTE — Progress Notes (Signed)
BP 118/82   Pulse 61   Temp 98.6 F (37 C)   Ht 5' 7.5" (1.715 m)   Wt 148 lb 11.2 oz (67.4 kg)   SpO2 100%   BMI 22.95 kg/m    Subjective:    Patient ID: Dana [name], female    DOB: [birth year], 59 y.o.   MRN: [id number]  HPI: Dana [name] is a 59 y.o. female presenting on 05/17/2020 for comprehensive medical examination.   Current medical complaints include:none  She currently lives with: husband Interim Problems from her last visit: yes- UTI requiring hospitalization and IV antibiotics earlier this year- all symptoms have resolved and she reports that she is having no urinary symptoms today.    She reports regular vision exams q1-5y: yes She reports regular dental exams q 3m: yes Her diet consists of: pretty healthy- high protein, veggies, fruits- low snacking She endorses exercise and/or activity of: walking- plans to start back to gym 3 times a week in January She works at: [employer]  She endorses ETOH use of wine socially She denies nictoine use  She denies illegal substance use    She reports post menopausal menstrual periods without any new concerns. Current menopausal symptoms: no She is currently sexually active with 1 partners. She denies concerns today about STI Contraception choices are: post menopausal  She endorses concerns about skin changes today: small red mark on the sternum- has skin check next week with dermatology She denies concerns about bowel changes today She denies concerns about bladder changes today  Depression Screen done today and results listed below:  Depression screen [facility] 2/9 05/17/2020 05/17/2020 04/04/2019 03/03/2018 02/02/2017  Decreased Interest 0 0 0 0 0  Down, Depressed, Hopeless 0 0 0 0 0  PHQ - 2 Score 0 0 0 0 0  Altered sleeping 0 - - - -  Tired, decreased energy 0 - - - -  Change in appetite 0 - - - -  Feeling bad or failure about yourself  0 - - - -  Trouble concentrating 0 - - - -  Moving slowly or  fidgety/restless 0 - - - -  Suicidal thoughts 0 - - - -  PHQ-9 Score 0 - - - -  Difficult doing work/chores Not difficult at all - - - -    She does not have a history of falls. I did not complete a risk assessment for falls. A plan of care for falls was not documented.   Past Medical History:  Past Medical History:  Diagnosis Date  . Breast calcification, right 07/03/2011  . Cancer ([place])   . History of right breast cancer   . Malignant neoplasm of right female breast ([place]) 05/17/2020    Surgical History:  Past Surgical History:  Procedure Laterality Date  . BREAST LUMPECTOMY    . BREAST RECONSTRUCTION    . MASTECTOMY, RADICAL  2010   right     Medications:  Current Outpatient Medications on File Prior to Visit  Medication Sig  . Calcium Carbonate-Vit D-Min (CALCIUM 1200 PO) Take 1 tablet by mouth daily.  Marland Kitchen FLUoxetine (PROZAC) 10 MG capsule TAKE 1 CAPSULE DAILY  . Multiple Vitamins-Minerals (MULTIVITAMIN ADULT PO) Take 1 tablet by mouth daily.  . Omega-3 Fatty Acids (FISH OIL) 1000 MG CAPS Take 1 capsule by mouth daily.   No current facility-administered medications on file prior to visit.    Allergies:  No Known Allergies  Social History:  Social History  Socioeconomic History  . Marital status: Married    Spouse name: Merry Proud  . Number of children: 2  . Years of education: Not on file  . Highest education level: Not on file  Occupational History  . Occupation: works for [employer]: [employer]  Tobacco Use  . Smoking status: Former Research scientist (life sciences)  . Smokeless tobacco: Never Used  Vaping Use  . Vaping Use: Never used  Substance and Sexual Activity  . Alcohol use: Yes    Alcohol/week: 6.0 standard drinks    Types: 6 Standard drinks or equivalent per week  . Drug use: No  . Sexual activity: Yes    Partners: Male    Birth control/protection: Post-menopausal  Other Topics Concern  . Not on file  Social History Narrative   Some exercise. 2-3  caffeine drinks per day.    Social Determinants of Health   Financial Resource Strain:   . Difficulty of Paying Living Expenses: Not on file  Food Insecurity:   . Worried About Charity fundraiser in the Last Year: Not on file  . Ran Out of Food in the Last Year: Not on file  Transportation Needs:   . Lack of Transportation (Medical): Not on file  . Lack of Transportation (Non-Medical): Not on file  Physical Activity:   . Days of Exercise per Week: Not on file  . Minutes of Exercise per Session: Not on file  Stress:   . Feeling of Stress : Not on file  Social Connections:   . Frequency of Communication with Friends and Family: Not on file  . Frequency of Social Gatherings with Friends and Family: Not on file  . Attends Religious Services: Not on file  . Active Member of Clubs or Organizations: Not on file  . Attends Archivist Meetings: Not on file  . Marital Status: Not on file  Intimate Partner Violence:   . Fear of Current or Ex-Partner: Not on file  . Emotionally Abused: Not on file  . Physically Abused: Not on file  . Sexually Abused: Not on file   Social History   Tobacco Use  Smoking Status Former Smoker  Smokeless Tobacco Never Used   Social History   Substance and Sexual Activity  Alcohol Use Yes  . Alcohol/week: 6.0 standard drinks  . Types: 6 Standard drinks or equivalent per week    Family History:  Family History  Problem Relation Age of Onset  . Esophageal cancer Mother   . Hypertension Father   . Coronary artery disease Father 57  . Alzheimer's disease Father     Past medical history, surgical history, medications, allergies, family history and social history reviewed with patient today and changes made to appropriate areas of the chart.   All ROS negative except what is listed above and in the HPI.      Objective:    BP 118/82   Pulse 61   Temp 98.6 F (37 C)   Ht 5' 7.5" (1.715 m)   Wt 148 lb 11.2 oz (67.4 kg)   SpO2 100%    BMI 22.95 kg/m   Wt Readings from Last 3 Encounters:  05/17/20 148 lb 11.2 oz (67.4 kg)  12/20/19 147 lb (66.7 kg)  12/10/19 145 lb (65.8 kg)    Physical Exam Vitals and nursing note reviewed.  Constitutional:      Appearance: Normal appearance. She is normal weight.  HENT:     Head: Normocephalic.  Right Ear: Tympanic membrane, ear canal and external ear normal.     Left Ear: Tympanic membrane, ear canal and external ear normal.  Eyes:     Extraocular Movements: Extraocular movements intact.     Conjunctiva/sclera: Conjunctivae normal.     Pupils: Pupils are equal, round, and reactive to light.  Neck:     Vascular: No carotid bruit.  Cardiovascular:     Rate and Rhythm: Normal rate and regular rhythm.     Pulses: Normal pulses.     Heart sounds: Normal heart sounds. No murmur heard.   Pulmonary:     Effort: Pulmonary effort is normal.     Breath sounds: Normal breath sounds.  Abdominal:     General: Abdomen is flat. Bowel sounds are normal. There is no distension.     Palpations: Abdomen is soft.     Tenderness: There is no abdominal tenderness. There is no right CVA tenderness, left CVA tenderness or guarding.  Musculoskeletal:        General: Normal range of motion.     Cervical back: Normal range of motion and neck supple. No rigidity or tenderness.     Right lower leg: No edema.     Left lower leg: No edema.  Lymphadenopathy:     Cervical: No cervical adenopathy.  Skin:    General: Skin is warm and dry.     Capillary Refill: Capillary refill takes less than 2 seconds.  Neurological:     General: No focal deficit present.     Mental Status: She is alert and oriented to person, place, and time.  Psychiatric:        Mood and Affect: Mood normal.        Behavior: Behavior normal.        Thought Content: Thought content normal.        Judgment: Judgment normal.     Results for orders placed or performed in visit on 12/20/19  POCT URINALYSIS DIP (CLINITEK)   Result Value Ref Range   Color, UA yellow yellow   Clarity, UA clear clear   Glucose, UA negative negative mg/dL   Bilirubin, UA negative negative   Ketones, POC UA negative negative mg/dL   Spec Grav, UA 1.015 1.010 - 1.025   Blood, UA negative negative   pH, UA 7.0 5.0 - 8.0   POC PROTEIN,UA negative negative, trace   Urobilinogen, UA 0.2 0.2 or 1.0 E.U./dL   Nitrite, UA Negative Negative   Leukocytes, UA Small (1+) (A) Negative      Assessment & Plan:   Problem List Items Addressed This Visit      Other   Depression    Continue Prozac as prescribed.  No concerns today or changes to plan of care.       History of right breast cancer    S/P mastectomy and implant.  Orders for mammogram placed today.  No changes or concerns.       Well woman exam (no gynecological exam) - Primary    Annual physical exam with labs performed today.  No abnormal findings noted. Healthy 59 year old female. Up to date on health maintenance with exception of mammogram, which was ordered today.  She has received her COVID booster and her flu vaccine.  Recommend immediate evaluation for any urinary symptoms that may present given quick onset of severe pyelonephritis earlier this year resulting in hospitalization.  Physical form completed for work and will fax when labs have resulted.  Will notify patient of lab results and make changes to plan of care as needed.  Follow-up in 1 year or sooner if needed.       Relevant Orders   CBC with Differential   Comprehensive metabolic panel   Hemoglobin A1c   Lipid panel    Other Visit Diagnoses    Screening for diabetes mellitus       Relevant Orders   Comprehensive metabolic panel   Hemoglobin A1c   Visit for screening mammogram       Relevant Orders   Mammogram Digital Screening   Screening for lipoid disorders       Relevant Orders   Lipid panel       Follow up plan: Return in 1 year (on 05/17/2021) for Annual Physical  Exam.   LABORATORY TESTING:  - Pap smear: up to date - Due 12/31/2020 - STI testing: N/A  IMMUNIZATIONS:   - Tdap: Tetanus vaccination status reviewed: last tetanus booster within 10 years. Due 2023 - Influenza: Up to date - Pneumovax: Not applicable - Prevnar: Not applicable - HPV: Not applicable - Shingrix vaccine: Up to date  SCREENING: -Mammogram: Due - ordered today - Colonoscopy: Up to date  - Bone Density: Not applicable  -Hearing Test: Not applicable  -Spirometry: Not applicable    NEXT PREVENTATIVE PHYSICAL DUE IN 1 YEAR. Return in 1 year (on 05/17/2021) for Annual Physical Exam.

## 2020-05-17 NOTE — Assessment & Plan Note (Addendum)
Annual physical exam with labs performed today.  No abnormal findings noted. Healthy 59 year old female. Up to date on health maintenance with exception of mammogram, which was ordered today.  She has received her COVID booster and her flu vaccine.  Recommend immediate evaluation for any urinary symptoms that may present given quick onset of severe pyelonephritis earlier this year resulting in hospitalization.  Physical form completed for work and will fax when labs have resulted.  Will notify patient of lab results and make changes to plan of care as needed.  Follow-up in 1 year or sooner if needed.

## 2020-05-18 LAB — COMPREHENSIVE METABOLIC PANEL
AG Ratio: 1.7 (calc) (ref 1.0–2.5)
ALT: 12 U/L (ref 6–29)
AST: 16 U/L (ref 10–35)
Albumin: 4.6 g/dL (ref 3.6–5.1)
Alkaline phosphatase (APISO): 48 U/L (ref 37–153)
BUN: 10 mg/dL (ref 7–25)
CO2: 26 mmol/L (ref 20–32)
Calcium: 9.4 mg/dL (ref 8.6–10.4)
Chloride: 104 mmol/L (ref 98–110)
Creat: 0.61 mg/dL (ref 0.50–0.99)
Globulin: 2.7 g/dL (calc) (ref 1.9–3.7)
Glucose, Bld: 94 mg/dL (ref 65–99)
Potassium: 4.5 mmol/L (ref 3.5–5.3)
Sodium: 142 mmol/L (ref 135–146)
Total Bilirubin: 0.5 mg/dL (ref 0.2–1.2)
Total Protein: 7.3 g/dL (ref 6.1–8.1)

## 2020-05-18 LAB — CBC WITH DIFFERENTIAL/PLATELET
Absolute Monocytes: 411 cells/uL (ref 200–950)
Basophils Absolute: 51 cells/uL (ref 0–200)
Basophils Relative: 1.5 %
Eosinophils Absolute: 71 cells/uL (ref 15–500)
Eosinophils Relative: 2.1 %
HCT: 41.8 % (ref 35.0–45.0)
Hemoglobin: 14 g/dL (ref 11.7–15.5)
Lymphs Abs: 1340 cells/uL (ref 850–3900)
MCH: 31.3 pg (ref 27.0–33.0)
MCHC: 33.5 g/dL (ref 32.0–36.0)
MCV: 93.5 fL (ref 80.0–100.0)
MPV: 10.8 fL (ref 7.5–12.5)
Monocytes Relative: 12.1 %
Neutro Abs: 1527 cells/uL (ref 1500–7800)
Neutrophils Relative %: 44.9 %
Platelets: 286 10*3/uL (ref 140–400)
RBC: 4.47 10*6/uL (ref 3.80–5.10)
RDW: 11.7 % (ref 11.0–15.0)
Total Lymphocyte: 39.4 %
WBC: 3.4 10*3/uL — ABNORMAL LOW (ref 3.8–10.8)

## 2020-05-18 LAB — LIPID PANEL
Cholesterol: 268 mg/dL — ABNORMAL HIGH (ref <200)
HDL: 91 mg/dL (ref >=50)
LDL Cholesterol (Calc): 162 mg/dL (calc) — ABNORMAL HIGH
Non-HDL Cholesterol (Calc): 177 mg/dL (calc) — ABNORMAL HIGH (ref <130)
Total CHOL/HDL Ratio: 2.9 (calc) (ref <5.0)
Triglycerides: 58 mg/dL (ref <150)

## 2020-05-18 LAB — HEMOGLOBIN A1C
Hgb A1c MFr Bld: 5.6 % of total Hgb (ref <5.7)
Mean Plasma Glucose: 114 (calc)
eAG (mmol/L): 6.3 (calc)

## 2020-05-21 NOTE — Progress Notes (Signed)
Nyriah,   Your labs look good overall. Your cholesterol (LDL) is just a little higher than I would like, but your HDL (good cholesterol) is great.   Monitor your diet and work to decrease foods with cholesterol, fat, and carbohydrates and this should balance out.

## 2020-05-25 DIAGNOSIS — D225 Melanocytic nevi of trunk: Secondary | ICD-10-CM | POA: Diagnosis not present

## 2020-05-25 DIAGNOSIS — D1801 Hemangioma of skin and subcutaneous tissue: Secondary | ICD-10-CM | POA: Diagnosis not present

## 2020-05-25 DIAGNOSIS — L814 Other melanin hyperpigmentation: Secondary | ICD-10-CM | POA: Diagnosis not present

## 2020-05-25 DIAGNOSIS — L821 Other seborrheic keratosis: Secondary | ICD-10-CM | POA: Diagnosis not present

## 2020-05-28 ENCOUNTER — Ambulatory Visit: Payer: BC Managed Care – PPO | Admitting: Dermatology

## 2021-02-08 ENCOUNTER — Other Ambulatory Visit: Payer: Self-pay | Admitting: Family Medicine

## 2021-02-08 ENCOUNTER — Telehealth: Payer: Self-pay

## 2021-02-08 NOTE — Telephone Encounter (Signed)
Please call pt to schedule appt.  No further refills until pt is seen.  T. Tashina Credit, CMA  

## 2021-02-08 NOTE — Telephone Encounter (Signed)
Called patient and LVM for patient to call back to get follow up appointment scheduled with Dr Madilyn Fireman. AM

## 2021-05-17 ENCOUNTER — Ambulatory Visit (INDEPENDENT_AMBULATORY_CARE_PROVIDER_SITE_OTHER): Payer: BC Managed Care – PPO | Admitting: Family Medicine

## 2021-05-17 ENCOUNTER — Other Ambulatory Visit: Payer: Self-pay

## 2021-05-17 ENCOUNTER — Encounter: Payer: Self-pay | Admitting: Family Medicine

## 2021-05-17 ENCOUNTER — Other Ambulatory Visit (HOSPITAL_COMMUNITY)
Admission: RE | Admit: 2021-05-17 | Discharge: 2021-05-17 | Disposition: A | Discharge status: Home / Self Care | Payer: BC Managed Care – PPO | Source: Ambulatory Visit | Attending: Family Medicine | Admitting: Family Medicine

## 2021-05-17 VITALS — BP 129/86 | HR 64

## 2021-05-17 DIAGNOSIS — Z0000 Encounter for general adult medical examination without abnormal findings: Secondary | ICD-10-CM

## 2021-05-17 DIAGNOSIS — Z124 Encounter for screening for malignant neoplasm of cervix: Secondary | ICD-10-CM

## 2021-05-17 DIAGNOSIS — Z23 Encounter for immunization: Secondary | ICD-10-CM

## 2021-05-17 DIAGNOSIS — F325 Major depressive disorder, single episode, in full remission: Secondary | ICD-10-CM

## 2021-05-17 DIAGNOSIS — Z1231 Encounter for screening mammogram for malignant neoplasm of breast: Secondary | ICD-10-CM

## 2021-05-17 DIAGNOSIS — Z131 Encounter for screening for diabetes mellitus: Secondary | ICD-10-CM | POA: Diagnosis not present

## 2021-05-17 MED ORDER — FLUOXETINE HCL 10 MG PO CAPS: 10.0000 mg | ORAL_CAPSULE | Freq: Every day | ORAL | 3 refills | Status: DC

## 2021-05-17 NOTE — Patient Instructions (Signed)
Blood work today Pap smear today Mammogram ordered - someone should call you to schedule this.  We will let you know results as they come in.

## 2021-05-17 NOTE — Progress Notes (Signed)
BP 129/86   Pulse 64   SpO2 100%    Subjective:    Patient ID: Dana [name], female    DOB: 1960-12-29, 60 y.o.   MRN: [id number]  HPI: Dana [name] is a 60 y.o. female presenting on 05/17/2021 for comprehensive medical examination. Current medical complaints include: none  She currently lives with: husband Interim Problems from her last visit: summer 2021 hospitalization for pyelonephritis, doing well now  She reports regular vision exams q1-5y: yes She reports regular dental exams q 31m: yes Her diet consists of: well balanced, healthy She endorses exercise and/or activity of: intermittent She works at: [employer]  She endorses ETOH use. - 3 servings per week She denies nictoine use. She denies illegal substance use.    She reports menopause around age 89 - on chemo for breast cancer Current menopausal symptoms: no She is currently  sexually active  She denies  concerns today about STI   She denies concerns about skin changes today. She denies concerns about bowel changes today. She denies concerns about bladder changes today.   Depression Screen done today and results listed below:  Depression screen [facility] 2/9 05/17/2021 05/17/2020 05/17/2020 04/04/2019 03/03/2018  Decreased Interest 0 0 0 0 0  Down, Depressed, Hopeless 0 0 0 0 0  PHQ - 2 Score 0 0 0 0 0  Altered sleeping - 0 - - -  Tired, decreased energy - 0 - - -  Change in appetite - 0 - - -  Feeling bad or failure about yourself  - 0 - - -  Trouble concentrating - 0 - - -  Moving slowly or fidgety/restless - 0 - - -  Suicidal thoughts - 0 - - -  PHQ-9 Score - 0 - - -  Difficult doing work/chores - Not difficult at all - - -    She does not have a history of falls.        Past Medical History:  Past Medical History:  Diagnosis Date   Breast calcification, right 07/03/2011   Cancer [facility])    History of right breast cancer    Malignant neoplasm of right female breast ([place]) 05/17/2020     Surgical History:  Past Surgical History:  Procedure Laterality Date   BREAST LUMPECTOMY     BREAST RECONSTRUCTION     MASTECTOMY, RADICAL  2010   right     Medications:  Current Outpatient Medications on File Prior to Visit  Medication Sig   Calcium Carbonate-Vit D-Min (CALCIUM 1200 PO) Take 1 tablet by mouth daily.   Multiple Vitamins-Minerals (MULTIVITAMIN ADULT PO) Take 1 tablet by mouth daily.   Omega-3 Fatty Acids (FISH OIL) 1000 MG CAPS Take 1 capsule by mouth daily.   No current facility-administered medications on file prior to visit.    Allergies:  No Known Allergies  Social History:  Social History   Socioeconomic History   Marital status: Married    Spouse name: Merry Proud   Number of children: 2   Years of education: Not on file   Highest education level: Not on file  Occupational History   Occupation: works for nonprofit     Employer: [employer]  Tobacco Use   Smoking status: Former   Smokeless tobacco: Never  Scientific laboratory technician Use: Never used  Substance and Sexual Activity   Alcohol use: Yes    Alcohol/week: 6.0 standard drinks    Types: 6 Standard drinks or equivalent per week  Drug use: No   Sexual activity: Yes    Partners: Male    Birth control/protection: Post-menopausal  Other Topics Concern   Not on file  Social History Narrative   Some exercise. 2-3 caffeine drinks per day.    Social Determinants of Health   Financial Resource Strain: Not on file  Food Insecurity: Not on file  Transportation Needs: Not on file  Physical Activity: Not on file  Stress: Not on file  Social Connections: Not on file  Intimate Partner Violence: Not on file   Social History   Tobacco Use  Smoking Status Former  Smokeless Tobacco Never   Social History   Substance and Sexual Activity  Alcohol Use Yes   Alcohol/week: 6.0 standard drinks   Types: 6 Standard drinks or equivalent per week    Family History:  Family History  Problem  Relation Age of Onset   Esophageal cancer Mother    Hypertension Father    Coronary artery disease Father 68   Alzheimer's disease Father     Past medical history, surgical history, medications, allergies, family history and social history reviewed with patient today and changes made to appropriate areas of the chart.   All ROS negative except what is listed above and in the HPI.      Objective:    BP 129/86   Pulse 64   SpO2 100%   Wt Readings from Last 3 Encounters:  05/17/20 148 lb 11.2 oz (67.4 kg)  12/20/19 147 lb (66.7 kg)  12/10/19 145 lb (65.8 kg)    Physical Exam Vitals reviewed.  Constitutional:      Appearance: Normal appearance. She is normal weight.  HENT:     Head: Normocephalic and atraumatic.     Right Ear: Tympanic membrane normal.     Left Ear: Tympanic membrane normal.     Nose: Nose normal.     Mouth/Throat:     Mouth: Mucous membranes are moist.     Pharynx: Oropharynx is clear.  Eyes:     Extraocular Movements: Extraocular movements intact.     Conjunctiva/sclera: Conjunctivae normal.     Pupils: Pupils are equal, round, and reactive to light.  Cardiovascular:     Rate and Rhythm: Normal rate and regular rhythm.     Pulses: Normal pulses.     Heart sounds: Normal heart sounds.  Pulmonary:     Effort: Pulmonary effort is normal.     Breath sounds: Normal breath sounds.  Abdominal:     General: Abdomen is flat. Bowel sounds are normal.     Palpations: Abdomen is soft.  Musculoskeletal:        General: Normal range of motion.     Cervical back: Normal range of motion and neck supple.  Skin:    General: Skin is warm and dry.     Capillary Refill: Capillary refill takes less than 2 seconds.  Neurological:     General: No focal deficit present.     Mental Status: She is alert and oriented to person, place, and time. Mental status is at baseline.  Psychiatric:        Mood and Affect: Mood normal.        Behavior: Behavior normal.         Thought Content: Thought content normal.        Judgment: Judgment normal.    Results for orders placed or performed in visit on 05/17/20  CBC with Differential  Result Value Ref Range  WBC 3.4 (L) 3.8 - 10.8 Thousand/uL   RBC 4.47 3.80 - 5.10 Million/uL   Hemoglobin 14.0 11.7 - 15.5 g/dL   HCT 41.8 35.0 - 45.0 %   MCV 93.5 80.0 - 100.0 fL   MCH 31.3 27.0 - 33.0 pg   MCHC 33.5 32.0 - 36.0 g/dL   RDW 11.7 11.0 - 15.0 %   Platelets 286 140 - 400 Thousand/uL   MPV 10.8 7.5 - 12.5 fL   Neutro Abs 1,527 1,500 - 7,800 cells/uL   Lymphs Abs 1,340 850 - 3,900 cells/uL   Absolute Monocytes 411 200 - 950 cells/uL   Eosinophils Absolute 71 15 - 500 cells/uL   Basophils Absolute 51 0 - 200 cells/uL   Neutrophils Relative % 44.9 %   Total Lymphocyte 39.4 %   Monocytes Relative 12.1 %   Eosinophils Relative 2.1 %   Basophils Relative 1.5 %  Comprehensive metabolic panel  Result Value Ref Range   Glucose, Bld 94 65 - 99 mg/dL   BUN 10 7 - 25 mg/dL   Creat 0.61 0.50 - 0.99 mg/dL   BUN/Creatinine Ratio NOT APPLICABLE 6 - 22 (calc)   Sodium 142 135 - 146 mmol/L   Potassium 4.5 3.5 - 5.3 mmol/L   Chloride 104 98 - 110 mmol/L   CO2 26 20 - 32 mmol/L   Calcium 9.4 8.6 - 10.4 mg/dL   Total Protein 7.3 6.1 - 8.1 g/dL   Albumin 4.6 3.6 - 5.1 g/dL   Globulin 2.7 1.9 - 3.7 g/dL (calc)   AG Ratio 1.7 1.0 - 2.5 (calc)   Total Bilirubin 0.5 0.2 - 1.2 mg/dL   Alkaline phosphatase (APISO) 48 37 - 153 U/L   AST 16 10 - 35 U/L   ALT 12 6 - 29 U/L  Hemoglobin A1c  Result Value Ref Range   Hgb A1c MFr Bld 5.6 <5.7 % of total Hgb   Mean Plasma Glucose 114 (calc)   eAG (mmol/L) 6.3 (calc)  Lipid panel  Result Value Ref Range   Cholesterol 268 (H) <200 mg/dL   HDL 91 > OR = 50 mg/dL   Triglycerides 58 <150 mg/dL   LDL Cholesterol (Calc) 162 (H) mg/dL (calc)   Total CHOL/HDL Ratio 2.9 <5.0 (calc)   Non-HDL Cholesterol (Calc) 177 (H) <130 mg/dL (calc)      Assessment & Plan:   Problem List  Items Addressed This Visit       Other   Depression   Relevant Medications   FLUoxetine (PROZAC) 10 MG capsule   Other Visit Diagnoses     Needs flu shot    -  Primary   Relevant Orders   Flu Vaccine QUAD 70mo+IM (Fluarix, Fluzone & Alfiuria Quad PF) (Completed)   Annual physical exam       Relevant Orders   CBC with Differential/Platelet   Comprehensive metabolic panel   Hemoglobin A1c   Lipid panel   TSH   Cytology - PAP   MM DIGITAL SCREENING BILATERAL   Screening for diabetes mellitus       Relevant Orders   Comprehensive metabolic panel   Hemoglobin A1c   Encounter for screening mammogram for malignant neoplasm of breast       Relevant Orders   MM DIGITAL SCREENING BILATERAL   Screening for cervical cancer       Relevant Orders   Cytology - PAP          LABORATORY TESTING:  - Health maintenance labs  ordered today as discussed above.           CBC, CMP, LIPIDS          TSH - high risk or symptoms          A1c - hx, high risk, symptomatic  - STI testing: deferred - Pap smear: done today    IMMUNIZATIONS:   - Tdap: Tetanus vaccination status reviewed: last tetanus booster within 10 years. - Influenza: Administered today - Pneumovax: Administered today - Prevnar: Not applicable - HPV: Not applicable - Shingrix vaccine: Up to date - COVID-19: Up to date  SCREENING: - Mammogram: ordered  - Bone Density: Not applicable - Colonoscopy: Up to date   - AAA Screening: Up to date  -Hearing Test: Not applicable  -Spirometry: Not applicable  - Lung Cancer Screening: Not applicable    PATIENT COUNSELING:   Advised to take 1 mg of folate supplement per day if capable of pregnancy.   Sexuality: Discussed sexually transmitted diseases, partner selection, use of condoms, avoidance of unintended pregnancy, and contraceptive alternatives.    I discussed with the patient that most people either abstain from alcohol or drink within safe limits (<=14/week and <=4  drinks/occasion for males, <=7/weeks and <= 3 drinks/occasion for females) and that the risk for alcohol disorders and other health effects rises proportionally with the number of drinks per week and how often a drinker exceeds daily limits.  Discussed cessation/primary prevention of drug use and availability of treatment for abuse.   Diet: Encouraged to adjust caloric intake to maintain or achieve ideal body weight, to reduce intake of dietary saturated fat and total fat, to limit sodium intake by avoiding high sodium foods and not adding table salt, and to maintain adequate dietary potassium and calcium preferably from fresh fruits, vegetables, and low-fat dairy products. Encouraged vitamin D 1000 units and Calcium 1300mg  or 4 servings of dairy a day.  Emphasized the importance of regular exercise.  Injury prevention: Discussed safety belts, safety helmets, smoke detector, smoking near bedding or upholstery.   Dental health: Discussed importance of regular tooth brushing, flossing, and dental visits.  Follow up plan:  Return in about 1 year (around 05/17/2022).   [employer]cell [employer] Olevia Bowens, DNP, FNP-C

## 2021-05-18 LAB — CBC WITH DIFFERENTIAL/PLATELET
Absolute Monocytes: 405 cells/uL (ref 200–950)
Basophils Absolute: 50 cells/uL (ref 0–200)
Basophils Relative: 1.1 %
Eosinophils Absolute: 81 cells/uL (ref 15–500)
Eosinophils Relative: 1.8 %
HCT: 43.9 % (ref 35.0–45.0)
Hemoglobin: 14.5 g/dL (ref 11.7–15.5)
Lymphs Abs: 1638 cells/uL (ref 850–3900)
MCH: 31.4 pg (ref 27.0–33.0)
MCHC: 33 g/dL (ref 32.0–36.0)
MCV: 95 fL (ref 80.0–100.0)
MPV: 11.1 fL (ref 7.5–12.5)
Monocytes Relative: 9 %
Neutro Abs: 2327 cells/uL (ref 1500–7800)
Neutrophils Relative %: 51.7 %
Platelets: 320 10*3/uL (ref 140–400)
RBC: 4.62 10*6/uL (ref 3.80–5.10)
RDW: 11.5 % (ref 11.0–15.0)
Total Lymphocyte: 36.4 %
WBC: 4.5 10*3/uL (ref 3.8–10.8)

## 2021-05-18 LAB — COMPREHENSIVE METABOLIC PANEL
AG Ratio: 1.8 (calc) (ref 1.0–2.5)
ALT: 15 U/L (ref 6–29)
AST: 21 U/L (ref 10–35)
Albumin: 4.6 g/dL (ref 3.6–5.1)
Alkaline phosphatase (APISO): 47 U/L (ref 37–153)
BUN: 8 mg/dL (ref 7–25)
CO2: 28 mmol/L (ref 20–32)
Calcium: 9.3 mg/dL (ref 8.6–10.4)
Chloride: 103 mmol/L (ref 98–110)
Creat: 0.61 mg/dL (ref 0.50–1.05)
Globulin: 2.6 g/dL (calc) (ref 1.9–3.7)
Glucose, Bld: 91 mg/dL (ref 65–99)
Potassium: 4.7 mmol/L (ref 3.5–5.3)
Sodium: 139 mmol/L (ref 135–146)
Total Bilirubin: 0.5 mg/dL (ref 0.2–1.2)
Total Protein: 7.2 g/dL (ref 6.1–8.1)

## 2021-05-18 LAB — LIPID PANEL
Cholesterol: 220 mg/dL — ABNORMAL HIGH (ref <200)
HDL: 67 mg/dL (ref >=50)
LDL Cholesterol (Calc): 139 mg/dL (calc) — ABNORMAL HIGH
Non-HDL Cholesterol (Calc): 153 mg/dL (calc) — ABNORMAL HIGH (ref <130)
Total CHOL/HDL Ratio: 3.3 (calc) (ref <5.0)
Triglycerides: 53 mg/dL (ref <150)

## 2021-05-18 LAB — HEMOGLOBIN A1C
Hgb A1c MFr Bld: 5.6 % of total Hgb (ref <5.7)
Mean Plasma Glucose: 114 mg/dL
eAG (mmol/L): 6.3 mmol/L

## 2021-05-18 LAB — TSH: TSH: 1.86 mIU/L (ref 0.40–4.50)

## 2021-05-20 LAB — CYTOLOGY - PAP
Comment: NEGATIVE
Diagnosis: NEGATIVE
High risk HPV: NEGATIVE

## 2021-05-21 ENCOUNTER — Encounter: Payer: BC Managed Care – PPO | Admitting: Family Medicine

## 2021-05-31 ENCOUNTER — Other Ambulatory Visit: Payer: Self-pay | Admitting: Family Medicine

## 2021-05-31 DIAGNOSIS — F325 Major depressive disorder, single episode, in full remission: Secondary | ICD-10-CM

## 2021-06-19 ENCOUNTER — Ambulatory Visit (INDEPENDENT_AMBULATORY_CARE_PROVIDER_SITE_OTHER): Payer: BC Managed Care – PPO

## 2021-06-19 ENCOUNTER — Other Ambulatory Visit: Payer: Self-pay

## 2021-06-19 DIAGNOSIS — Z1231 Encounter for screening mammogram for malignant neoplasm of breast: Secondary | ICD-10-CM | POA: Diagnosis not present

## 2021-08-17 ENCOUNTER — Emergency Department (INDEPENDENT_AMBULATORY_CARE_PROVIDER_SITE_OTHER)
Admission: EM | Admit: 2021-08-17 | Discharge: 2021-08-17 | Disposition: A | Discharge status: Home / Self Care | Payer: BC Managed Care – PPO | Source: Home / Self Care | Attending: Family Medicine | Admitting: Family Medicine

## 2021-08-17 ENCOUNTER — Other Ambulatory Visit: Payer: Self-pay

## 2021-08-17 ENCOUNTER — Emergency Department
Admission: EM | Admit: 2021-08-17 | Discharge: 2021-08-17 | Discharge status: Absent without leave | Payer: BC Managed Care – PPO | Source: Home / Self Care

## 2021-08-17 ENCOUNTER — Encounter: Payer: Self-pay | Admitting: Emergency Medicine

## 2021-08-17 DIAGNOSIS — J029 Acute pharyngitis, unspecified: Secondary | ICD-10-CM | POA: Diagnosis not present

## 2021-08-17 DIAGNOSIS — J069 Acute upper respiratory infection, unspecified: Secondary | ICD-10-CM

## 2021-08-17 LAB — POC SARS CORONAVIRUS 2 AG -  ED: SARS Coronavirus 2 Ag: NEGATIVE

## 2021-08-17 LAB — POCT RAPID STREP A (OFFICE): Rapid Strep A Screen: NEGATIVE

## 2021-08-17 MED ORDER — AMOXICILLIN-POT CLAVULANATE 875-125 MG PO TABS: 1.0000 | ORAL_TABLET | Freq: Two times a day (BID) | ORAL | 0 refills | Status: DC

## 2021-08-17 NOTE — ED Triage Notes (Signed)
Patient states that she's had URI sx's x 2 weeks, cough, congestion, sinus pressure.  Last night developed sore throat and fever of 102.  Patient did take Tylenol this am. ?

## 2021-08-17 NOTE — ED Provider Notes (Signed)
?[place] ? ? ? ?CSN: [id number] ?Arrival date & time: 08/17/21  3845 ? ? ?  ? ?History   ?Chief Complaint ?Chief Complaint  ?Patient presents with  ? Cough  ? Sore Throat  ? ? ?HPI ?Dana [name] is a 61 y.o. female.  ? ?HPI ?Patient states that she has had cold symptoms for about 2 weeks.  Her husband is sick as well.  He has developed bronchitis and took antibiotics to try to help and still coughs, 6 weeks.  Patient states that after 2 weeks of cold symptoms last night she developed a sore throat.  It is painful.  She also had a fever over 102.  She does not know whether she has a worsening infection from her cold, or whether she has caught another virus (COVID) ?She is COVID and flu vaccinated ?Past Medical History:  ?Diagnosis Date  ? Breast calcification, right 07/03/2011  ? Breast cancer ([place])   ? Cancer [facility])   ? Malignant neoplasm of right female breast ([place]) 05/17/2020  ? ? ?Patient Active Problem List  ? Diagnosis Date Noted  ? Well woman exam (no gynecological exam) 05/17/2020  ? Acute pyelonephritis 12/10/2019  ? Lumbar degenerative disc disease 07/11/2019  ? IFG (impaired fasting glucose) 03/19/2014  ? History of right breast cancer 07/03/2011  ? Depression 07/10/2008  ? ? ?Past Surgical History:  ?Procedure Laterality Date  ? BREAST LUMPECTOMY    ? BREAST RECONSTRUCTION    ? MASTECTOMY, RADICAL  2010  ? right   ? ? ?OB History   ?No obstetric history on file. ?  ? ? ? ?Home Medications   ? ?Prior to Admission medications   ?Medication Sig Start Date End Date Taking? Authorizing Provider  ?amoxicillin-clavulanate (AUGMENTIN) 875-125 MG tablet Take 1 tablet by mouth every 12 (twelve) hours. 08/17/21  Yes Raylene Everts, MD  ?Calcium Carbonate-Vit D-Min (CALCIUM 1200 PO) Take 1 tablet by mouth daily.   Yes [provider]  ?FLUoxetine (PROZAC) 10 MG capsule TAKE 1 CAPSULE BY MOUTH EVERY DAY 06/03/21  Yes Terrilyn Saver, NP  ?Multiple Vitamins-Minerals (MULTIVITAMIN ADULT PO)  Take 1 tablet by mouth daily.   Yes [provider]  ?Omega-3 Fatty Acids (FISH OIL) 1000 MG CAPS Take 1 capsule by mouth daily.   Yes [provider]  ? ? ?Family History ?Family History  ?Problem Relation Age of Onset  ? Esophageal cancer Mother   ? Hypertension Father   ? Coronary artery disease Father 68  ? Alzheimer's disease Father   ? ? ?Social History ?Social History  ? ?Tobacco Use  ? Smoking status: Former  ? Smokeless tobacco: Never  ?Vaping Use  ? Vaping Use: Never used  ?Substance Use Topics  ? Alcohol use: Yes  ?  Alcohol/week: 6.0 standard drinks  ?  Types: 6 Standard drinks or equivalent per week  ? Drug use: No  ? ? ? ?Allergies   ?Patient has no known allergies. ? ? ?Review of Systems ?Review of Systems ?See HPI ? ?Physical Exam ?Triage Vital Signs ?ED Triage Vitals  ?Enc Vitals Group  ?   BP 08/17/21 0929 95/61  ?   Pulse Rate 08/17/21 0929 94  ?   Resp 08/17/21 0929 18  ?   Temp 08/17/21 0929 98.6 ?F (37 ?C)  ?   Temp Source 08/17/21 0929 Oral  ?   SpO2 08/17/21 0929 99 %  ?   Weight 08/17/21 0930 144 lb (65.3 kg)  ?  Height 08/17/21 0930 '5\' 8"'$  (1.727 m)  ?   Head Circumference --   ?   Peak Flow --   ?   Pain Score 08/17/21 0930 2  ?   Pain Loc --   ?   Pain Edu? --   ?   Excl. in [place]? --   ? ?No data found. ? ?Updated Vital Signs ?BP 95/61 (BP Location: Left Arm)   Pulse 94   Temp 98.6 ?F (37 ?C) (Oral)   Resp 18   Ht '5\' 8"'$  (1.727 m)   Wt 65.3 kg   SpO2 99%   BMI 21.90 kg/m?  ?   ? ?Physical Exam ?Constitutional:   ?   General: She is not in acute distress. ?   Appearance: She is well-developed. She is ill-appearing.  ?HENT:  ?   Head: Normocephalic and atraumatic.  ?   Right Ear: Tympanic membrane and ear canal normal.  ?   Left Ear: Tympanic membrane and ear canal normal.  ?   Nose: Congestion present. No rhinorrhea.  ?   Mouth/Throat:  ?   Mouth: Mucous membranes are moist.  ?   Pharynx: Posterior oropharyngeal erythema present.  ?Eyes:  ?   Conjunctiva/sclera:  Conjunctivae normal.  ?   Pupils: Pupils are equal, round, and reactive to light.  ?Cardiovascular:  ?   Rate and Rhythm: Normal rate and regular rhythm.  ?   Heart sounds: Normal heart sounds.  ?Pulmonary:  ?   Effort: Pulmonary effort is normal. No respiratory distress.  ?   Breath sounds: Normal breath sounds. No wheezing or rales.  ?Abdominal:  ?   General: There is no distension.  ?   Palpations: Abdomen is soft.  ?Musculoskeletal:     ?   General: Normal range of motion.  ?   Cervical back: Normal range of motion.  ?Lymphadenopathy:  ?   Cervical: No cervical adenopathy.  ?Skin: ?   General: Skin is warm and dry.  ?Neurological:  ?   Mental Status: She is alert.  ? ? ? ?UC Treatments / Results  ?Labs ?(all labs ordered are listed, but only abnormal results are displayed) ?Labs Reviewed  ?CULTURE, GROUP A STREP  ?POCT RAPID STREP A (OFFICE)  ?POC SARS CORONAVIRUS 2 AG -  ED  ? ? ?EKG ? ? ?Radiology ?No results found. ? ?Procedures ?Procedures (including critical care time) ? ?Medications Ordered in UC ?Medications - No data to display ? ?Initial Impression / Assessment and Plan / UC Course  ?I have reviewed the triage vital signs and the nursing notes. ? ?Pertinent labs & imaging results that were available during my care of the patient were reviewed by me and considered in my medical decision making (see chart for details). ? ?  ? ?Because patient is having worsening symptoms 2 weeks after the onset of a viral illness, I am going to cover her with antibiotics.  Testing is negative. ?Final Clinical Impressions(s) / UC Diagnoses  ? ?Final diagnoses:  ?Sore throat  ?Viral upper respiratory tract infection  ? ? ? ?Discharge Instructions   ? ?  ?Take Augmentin antibiotic 2 times a day for a week.  Take with food ?Consider taking probiotic while on Augmentin to protect stomach ?Continue with over-the-counter cough and cold medicine as needed ? ?Strep test is negative.  Culture will be sent to the hospital.  You will  be called if it is positive ?COVID test is negative ? ? ? ? ?  ED Prescriptions   ? ? Medication Sig Dispense Auth. Provider  ? amoxicillin-clavulanate (AUGMENTIN) 875-125 MG tablet Take 1 tablet by mouth every 12 (twelve) hours. 14 tablet Raylene Everts, MD  ? ?  ? ?PDMP not reviewed this encounter. ?  ?Raylene Everts, MD ?08/17/21 1124 ? ?

## 2021-08-17 NOTE — Discharge Instructions (Signed)
Take Augmentin antibiotic 2 times a day for a week.  Take with food ?Consider taking probiotic while on Augmentin to protect stomach ?Continue with over-the-counter cough and cold medicine as needed ? ?Strep test is negative.  Culture will be sent to the hospital.  You will be called if it is positive ?COVID test is negative ?

## 2021-08-21 LAB — CULTURE, GROUP A STREP: Strep A Culture: NEGATIVE

## 2021-08-28 ENCOUNTER — Ambulatory Visit (INDEPENDENT_AMBULATORY_CARE_PROVIDER_SITE_OTHER): Payer: BC Managed Care – PPO | Admitting: Physician Assistant

## 2021-08-28 ENCOUNTER — Ambulatory Visit (INDEPENDENT_AMBULATORY_CARE_PROVIDER_SITE_OTHER): Payer: BC Managed Care – PPO

## 2021-08-28 ENCOUNTER — Other Ambulatory Visit: Payer: Self-pay

## 2021-08-28 ENCOUNTER — Encounter: Payer: Self-pay | Admitting: Physician Assistant

## 2021-08-28 VITALS — BP 133/73 | HR 65 | Temp 97.4°F | Ht 68.0 in | Wt 147.0 lb

## 2021-08-28 DIAGNOSIS — R059 Cough, unspecified: Secondary | ICD-10-CM | POA: Diagnosis not present

## 2021-08-28 DIAGNOSIS — R058 Other specified cough: Secondary | ICD-10-CM

## 2021-08-28 DIAGNOSIS — J069 Acute upper respiratory infection, unspecified: Secondary | ICD-10-CM

## 2021-08-28 MED ORDER — METHYLPREDNISOLONE 4 MG PO TBPK: ORAL_TABLET | ORAL | 0 refills | Status: DC

## 2021-08-28 MED ORDER — HYDROCOD POLI-CHLORPHE POLI ER 10-8 MG/5ML PO SUER: 5.0000 mL | Freq: Two times a day (BID) | ORAL | 0 refills | Status: DC | PRN

## 2021-08-28 NOTE — Patient Instructions (Signed)

## 2021-08-28 NOTE — Progress Notes (Signed)
? ?  Subjective:  ? ? Patient ID: Dana [name], female    DOB: December 09, 1960, 61 y.o.   MRN: [id number] ? ?HPI ?Pt is a 61 yo female who presents to the clinic with ongoing cough, congestion, sinus pressure and draining for the last 4-5 weeks. She went to UC on 3/4 and was given augmentin. She has finished that and most of the sinus pressure and drainage is cough but she still has a dry cough. No fever, chills, SOB. No hx of asthma or COPD. Cough is keeping her up at night.  ? ?.. ?Active Ambulatory Problems  ?  Diagnosis Date Noted  ? Depression 07/10/2008  ? History of right breast cancer 07/03/2011  ? IFG (impaired fasting glucose) 03/19/2014  ? Lumbar degenerative disc disease 07/11/2019  ? Acute pyelonephritis 12/10/2019  ? Well woman exam (no gynecological exam) 05/17/2020  ? ?Resolved Ambulatory Problems  ?  Diagnosis Date Noted  ? BREAST MASS, RIGHT 07/10/2008  ? Malignant neoplasm of right female breast ([place]) 05/17/2020  ? ?Past Medical History:  ?Diagnosis Date  ? Breast calcification, right 07/03/2011  ? Breast cancer ([place])   ? Cancer [facility])   ? ? ? ?Review of Systems ?See HPI.  ?   ?Objective:  ? Physical Exam ?Vitals reviewed.  ?Constitutional:   ?   Appearance: She is well-developed.  ?HENT:  ?   Head: Normocephalic.  ?   Right Ear: Tympanic membrane and ear canal normal. No middle ear effusion. Tympanic membrane is not erythematous.  ?   Left Ear: Tympanic membrane and ear canal normal.  No middle ear effusion. Tympanic membrane is not erythematous.  ?   Nose: No congestion or rhinorrhea.  ?   Mouth/Throat:  ?   Mouth: Mucous membranes are moist. No oral lesions.  ?   Pharynx: Uvula midline. Posterior oropharyngeal erythema present. No pharyngeal swelling, oropharyngeal exudate or uvula swelling.  ?   Tonsils: No tonsillar exudate.  ?Eyes:  ?   Conjunctiva/sclera: Conjunctivae normal.  ?   Pupils: Pupils are equal, round, and reactive to light.  ?Cardiovascular:  ?   Rate and Rhythm: Normal rate and  regular rhythm.  ?   Heart sounds: Normal heart sounds. No murmur heard. ?Pulmonary:  ?   Effort: Pulmonary effort is normal.  ?   Breath sounds: Normal breath sounds. No wheezing or rhonchi.  ?Musculoskeletal:  ?   Cervical back: Normal range of motion.  ?Neurological:  ?   General: No focal deficit present.  ?   Mental Status: She is alert.  ?Psychiatric:     ?   Mood and Affect: Mood normal.  ? ? ? ? ? ?   ?Assessment & Plan:  ?..Danny was seen today for sore throat. ? ?Diagnoses and all orders for this visit: ? ?Post-viral cough syndrome ?-     chlorpheniramine-HYDROcodone (TUSSIONEX PENNKINETIC ER) 10-8 MG/5ML; Take 5 mLs by mouth every 12 (twelve) hours as needed for cough. ?-     methylPREDNISolone (MEDROL DOSEPAK) 4 MG TBPK tablet; Take as directed by package insert. ?-     DG Chest 2 View; Future ? ? ?No worrisome signs on exam.  ?Due to cough for 5 weeks get CXR. ?Tussionex and medrol dose pack given.  ?Continue to use salt water gargles and nettie pot rinses.  ?Follow up as needed if symptoms persist or worrisen.  ? ? ?

## 2021-08-30 ENCOUNTER — Encounter: Payer: Self-pay | Admitting: Physician Assistant

## 2021-09-02 NOTE — Progress Notes (Signed)
Normal CXR

## 2021-09-04 ENCOUNTER — Other Ambulatory Visit: Payer: Self-pay

## 2021-09-04 ENCOUNTER — Emergency Department (INDEPENDENT_AMBULATORY_CARE_PROVIDER_SITE_OTHER)
Admission: EM | Admit: 2021-09-04 | Discharge: 2021-09-04 | Disposition: A | Discharge status: Home / Self Care | Payer: BC Managed Care – PPO | Source: Home / Self Care | Attending: Family Medicine | Admitting: Family Medicine

## 2021-09-04 DIAGNOSIS — J029 Acute pharyngitis, unspecified: Secondary | ICD-10-CM | POA: Diagnosis not present

## 2021-09-04 DIAGNOSIS — J069 Acute upper respiratory infection, unspecified: Secondary | ICD-10-CM

## 2021-09-04 LAB — POCT RAPID STREP A (OFFICE): Rapid Strep A Screen: NEGATIVE

## 2021-09-04 MED ORDER — CLARITHROMYCIN 500 MG PO TABS: 500.0000 mg | ORAL_TABLET | Freq: Two times a day (BID) | ORAL | 0 refills | Status: DC

## 2021-09-04 NOTE — ED Triage Notes (Signed)
Pt presents with continued URI sx, sore throat and fever x 5 weeks. Pt states she has had negative covid, strep, and chest xray. Pt taking sudafed for comfort.  ?

## 2021-09-04 NOTE — Discharge Instructions (Signed)
Make sure you are drinking lots of fluids ?Run a humidifier if you have 1 ?Take the antibiotic Biaxin 2 times a day with food ?See Dr. Madilyn Fireman if not improving by next week ?The blood count will be available tomorrow.  You can check your results on MyChart.  You will be called if it is abnormal ?Try to get enough rest.  Let me know if you need a note for work ?

## 2021-09-04 NOTE — ED Provider Notes (Signed)
?[place] ? ? ? ?CSN: [id number] ?Arrival date & time: 09/04/21  1507 ? ? ?  ? ?History   ?Chief Complaint ?Chief Complaint  ?Patient presents with  ? Nasal Congestion  ? Cough  ? Weakness  ? ? ?HPI ?Dana [name] is a 61 y.o. female.  ? ?HPI ? ?This is a follow-up visit for this pleasant 61 year old woman.  I saw her on 08/17/2021 for an upper respiratory infection and sinus infection.  I treated her with Augmentin.  She states that she did have some improvement in her symptoms, but continued to have coughing, fatigue, difficulty sleeping.  She saw a provider at her primary care and was diagnosed with post viral cough.  She was treated with a Medrol Dosepak.  She states that her cough is a little bit better.  She is sick again now.  She thinks that she is never improved from her original visit, and has been sick for 5 weeks.  She has a sore throat only on the right side.  Pain in her right ear.  And had a fever again to 100.3 yesterday. ?She had a negative strep test, she had a negative COVID test, she had a negative flu test at her original visit.  She had a normal chest x-ray. ?Strep test is repeated today and is negative ? ?Past Medical History:  ?Diagnosis Date  ? Breast calcification, right 07/03/2011  ? Breast cancer ([place])   ? Cancer [facility])   ? Malignant neoplasm of right female breast ([place]) 05/17/2020  ? ? ?Patient Active Problem List  ? Diagnosis Date Noted  ? Well woman exam (no gynecological exam) 05/17/2020  ? Acute pyelonephritis 12/10/2019  ? Lumbar degenerative disc disease 07/11/2019  ? IFG (impaired fasting glucose) 03/19/2014  ? History of right breast cancer 07/03/2011  ? Depression 07/10/2008  ? ? ?Past Surgical History:  ?Procedure Laterality Date  ? BREAST LUMPECTOMY    ? BREAST RECONSTRUCTION    ? MASTECTOMY, RADICAL  2010  ? right   ? ? ?OB History   ?No obstetric history on file. ?  ? ? ? ?Home Medications   ? ?Prior to Admission medications   ?Medication Sig Start Date End  Date Taking? Authorizing Provider  ?clarithromycin (BIAXIN) 500 MG tablet Take 1 tablet (500 mg total) by mouth 2 (two) times daily. 09/04/21  Yes Raylene Everts, MD  ?Calcium Carbonate-Vit D-Min (CALCIUM 1200 PO) Take 1 tablet by mouth daily.    [provider]  ?chlorpheniramine-HYDROcodone Amanda Cockayne PENNKINETIC ER) 10-8 MG/5ML Take 5 mLs by mouth every 12 (twelve) hours as needed for cough. 08/28/21   Donella Stade, PA-C  ?FLUoxetine (PROZAC) 10 MG capsule TAKE 1 CAPSULE BY MOUTH EVERY DAY 06/03/21   Terrilyn Saver, NP  ?Multiple Vitamins-Minerals (MULTIVITAMIN ADULT PO) Take 1 tablet by mouth daily.    [provider]  ?Omega-3 Fatty Acids (FISH OIL) 1000 MG CAPS Take 1 capsule by mouth daily.    [provider]  ? ? ?Family History ?Family History  ?Problem Relation Age of Onset  ? Esophageal cancer Mother   ? Hypertension Father   ? Coronary artery disease Father 52  ? Alzheimer's disease Father   ? ? ?Social History ?Social History  ? ?Tobacco Use  ? Smoking status: Former  ? Smokeless tobacco: Never  ?Vaping Use  ? Vaping Use: Never used  ?Substance Use Topics  ? Alcohol use: Yes  ?  Alcohol/week: 6.0 standard drinks  ?  Types: 6 Standard drinks or equivalent per week  ? Drug use: No  ? ? ? ?Allergies   ?Patient has no known allergies. ? ? ?Review of Systems ?Review of Systems ?See HPI ? ?Physical Exam ?Triage Vital Signs ?ED Triage Vitals  ?Enc Vitals Group  ?   BP 09/04/21 1514 130/86  ?   Pulse Rate 09/04/21 1514 86  ?   Resp 09/04/21 1514 14  ?   Temp 09/04/21 1514 98.1 ?F (36.7 ?C)  ?   Temp Source 09/04/21 1514 Oral  ?   SpO2 09/04/21 1514 99 %  ?   Weight --   ?   Height --   ?   Head Circumference --   ?   Peak Flow --   ?   Pain Score 09/04/21 1517 0  ?   Pain Loc --   ?   Pain Edu? --   ?   Excl. in [place]? --   ? ?No data found. ? ?Updated Vital Signs ?BP 130/86 (BP Location: Left Arm)   Pulse 86   Temp 98.1 ?F (36.7 ?C) (Oral)   Resp 14   SpO2 99%  ? ?    ? ?Physical Exam ?Constitutional:   ?   General: She is not in acute distress. ?   Appearance: She is well-developed. She is ill-appearing.  ?   Comments: Appears tired  ?HENT:  ?   Head: Normocephalic and atraumatic.  ?   Right Ear: Tympanic membrane and ear canal normal.  ?   Left Ear: Tympanic membrane and ear canal normal.  ?   Nose: Nose normal. No congestion.  ?   Mouth/Throat:  ?   Mouth: Mucous membranes are moist.  ?   Pharynx: Uvula midline. Pharyngeal swelling and posterior oropharyngeal erythema present. No oropharyngeal exudate or uvula swelling.  ? ?Eyes:  ?   Conjunctiva/sclera: Conjunctivae normal.  ?   Pupils: Pupils are equal, round, and reactive to light.  ?Cardiovascular:  ?   Rate and Rhythm: Regular rhythm. Bradycardia present.  ?   Heart sounds: Normal heart sounds.  ?Pulmonary:  ?   Effort: Pulmonary effort is normal. No respiratory distress.  ?   Breath sounds: Normal breath sounds. No wheezing or rhonchi.  ?Abdominal:  ?   General: There is no distension.  ?   Palpations: Abdomen is soft.  ?Musculoskeletal:     ?   General: Normal range of motion.  ?   Cervical back: Normal range of motion.  ?Skin: ?   General: Skin is warm and dry.  ?Neurological:  ?   Mental Status: She is alert.  ?Psychiatric:     ?   Mood and Affect: Mood normal.     ?   Behavior: Behavior normal.  ? ? ? ?UC Treatments / Results  ?Labs ?(all labs ordered are listed, but only abnormal results are displayed) ?Labs Reviewed  ?CULTURE, GROUP A STREP  ?CBC WITH DIFFERENTIAL/PLATELET  ?POCT RAPID STREP A (OFFICE)  ? ? ?EKG ? ? ?Radiology ?No results found. ? ?Procedures ?Procedures (including critical care time) ? ?Medications Ordered in UC ?Medications - No data to display ? ?Initial Impression / Assessment and Plan / UC Course  ?I have reviewed the triage vital signs and the nursing notes. ? ?Pertinent labs & imaging results that were available during my care of the patient were reviewed by me and considered in my medical  decision making (see chart for details). ? ?  ? ?  I have ordered a CBC to make sure this patient does not have impaired immunity or altered white count.  Have chosen an antibiotic to cover atypical forms of respiratory infection.  Should see her doctor if not improving by next week ?Final Clinical Impressions(s) / UC Diagnoses  ? ?Final diagnoses:  ?Sore throat  ?Upper respiratory tract infection, unspecified type  ? ? ? ?Discharge Instructions   ? ?  ?Make sure you are drinking lots of fluids ?Run a humidifier if you have 1 ?Take the antibiotic Biaxin 2 times a day with food ?See Dr. Madilyn Fireman if not improving by next week ?The blood count will be available tomorrow.  You can check your results on MyChart.  You will be called if it is abnormal ?Try to get enough rest.  Let me know if you need a note for work ? ? ?ED Prescriptions   ? ? Medication Sig Dispense Auth. Provider  ? clarithromycin (BIAXIN) 500 MG tablet Take 1 tablet (500 mg total) by mouth 2 (two) times daily. 14 tablet Raylene Everts, MD  ? ?  ? ?PDMP not reviewed this encounter. ?  ?Raylene Everts, MD ?09/04/21 1622 ? ?

## 2021-09-05 LAB — CBC WITH DIFFERENTIAL/PLATELET
Absolute Monocytes: 800 cells/uL (ref 200–950)
Basophils Absolute: 77 cells/uL (ref 0–200)
Basophils Relative: 0.9 %
Eosinophils Absolute: 129 cells/uL (ref 15–500)
Eosinophils Relative: 1.5 %
HCT: 43 % (ref 35.0–45.0)
Hemoglobin: 14.5 g/dL (ref 11.7–15.5)
Lymphs Abs: 1987 cells/uL (ref 850–3900)
MCH: 31.7 pg (ref 27.0–33.0)
MCHC: 33.7 g/dL (ref 32.0–36.0)
MCV: 93.9 fL (ref 80.0–100.0)
MPV: 11 fL (ref 7.5–12.5)
Monocytes Relative: 9.3 %
Neutro Abs: 5607 cells/uL (ref 1500–7800)
Neutrophils Relative %: 65.2 %
Platelets: 375 10*3/uL (ref 140–400)
RBC: 4.58 10*6/uL (ref 3.80–5.10)
RDW: 11.7 % (ref 11.0–15.0)
Total Lymphocyte: 23.1 %
WBC: 8.6 10*3/uL (ref 3.8–10.8)

## 2021-09-07 LAB — CULTURE, GROUP A STREP: Strep A Culture: NEGATIVE

## 2022-02-03 ENCOUNTER — Ambulatory Visit (INDEPENDENT_AMBULATORY_CARE_PROVIDER_SITE_OTHER): Payer: BC Managed Care – PPO | Admitting: Family Medicine

## 2022-02-03 ENCOUNTER — Encounter: Payer: Self-pay | Admitting: Family Medicine

## 2022-02-03 DIAGNOSIS — J0100 Acute maxillary sinusitis, unspecified: Secondary | ICD-10-CM

## 2022-02-03 MED ORDER — PREDNISONE 50 MG PO TABS: ORAL_TABLET | ORAL | 0 refills | Status: DC

## 2022-02-03 MED ORDER — DOXYCYCLINE HYCLATE 100 MG PO TABS: 100.0000 mg | ORAL_TABLET | Freq: Two times a day (BID) | ORAL | 0 refills | Status: AC

## 2022-02-03 NOTE — Progress Notes (Signed)
Dana [name] - 61 y.o. female MRN [id number]  Date of birth: 1960/09/15  Subjective Chief Complaint  Patient presents with   URI    HPI Dana [name] is a 61 year old female here today with complaint of upper airway and sinus congestion, mild cough and fatigue.  Reports having illness about a week and a half ago with fever congestion and some fatigue.  Felt like she was trying to get better however symptoms worsened again a couple days ago.  Seem worse at this time than they were initially.  She has not had any fever this time.  She did test for COVID at home which was negative.  She has not had chest pain, shortness of breath or GI symptoms.  She is using over-the-counter decongestants with some improvement.  ROS:  A comprehensive ROS was completed and negative except as noted per HPI  No Known Allergies  Past Medical History:  Diagnosis Date   Breast calcification, right 07/03/2011   Breast cancer ([facility])    Cancer ([facility])    Malignant neoplasm of right female breast ([place]) 05/17/2020    Past Surgical History:  Procedure Laterality Date   BREAST LUMPECTOMY     BREAST RECONSTRUCTION     MASTECTOMY, RADICAL  2010   right     Social History   Socioeconomic History   Marital status: Married    Spouse name: Merry Proud   Number of children: 2   Years of education: Not on file   Highest education level: Not on file  Occupational History   Occupation: works for nonprofit     Employer: [employer]  Tobacco Use   Smoking status: Former   Smokeless tobacco: Never  Scientific laboratory technician Use: Never used  Substance and Sexual Activity   Alcohol use: Yes    Alcohol/week: 6.0 standard drinks of alcohol    Types: 6 Standard drinks or equivalent per week   Drug use: No   Sexual activity: Yes    Partners: Male    Birth control/protection: Post-menopausal  Other Topics Concern   Not on file  Social History Narrative   Some exercise. 2-3 caffeine drinks per day.    Social Determinants of  Health   Financial Resource Strain: Not on file  Food Insecurity: Not on file  Transportation Needs: Not on file  Physical Activity: Not on file  Stress: Not on file  Social Connections: Not on file    Family History  Problem Relation Age of Onset   Esophageal cancer Mother    Hypertension Father    Coronary artery disease Father 6   Alzheimer's disease Father     Health Maintenance  Topic Date Due   INFLUENZA VACCINE  01/14/2022   COVID-19 Vaccine (4 - Moderna series) 02/19/2022 (Originally 05/13/2021)   TETANUS/TDAP  02/04/2023 (Originally 07/09/2021)   MAMMOGRAM  06/20/2023   COLONOSCOPY (Pts 45-71yr Insurance coverage will need to be confirmed)  08/21/2024   PAP SMEAR-Modifier  05/17/2026   Hepatitis C Screening  Completed   HIV Screening  Completed   Zoster Vaccines- Shingrix  Completed   Pneumococcal Vaccine [id number] old  Aged Out   HPV VACCINES  Aged Out     ----------------------------------------------------------------------------------------------------------------------------------------------------------------------------------------------------------------- Physical Exam BP (!) 152/93 (BP Location: Left Arm, Patient Position: Sitting, Cuff Size: Normal)   Pulse 63   Temp 98.2 F (36.8 C) (Oral)   Ht '5\' 8"'$  (1.727 m)   Wt 148 lb 1.9 oz (67.2 kg)   SpO2 100%  BMI 22.52 kg/m   Physical Exam Constitutional:      Appearance: Normal appearance.  HENT:     Right Ear: Tympanic membrane normal.     Left Ear: Tympanic membrane normal.  Eyes:     General: No scleral icterus. Cardiovascular:     Rate and Rhythm: Normal rate and regular rhythm.  Pulmonary:     Effort: Pulmonary effort is normal.     Breath sounds: Normal breath sounds.  Musculoskeletal:     Cervical back: Neck supple.  Neurological:     Mental Status: She is alert.  Psychiatric:        Mood and Affect: Mood normal.        Behavior: Behavior normal.      ------------------------------------------------------------------------------------------------------------------------------------------------------------------------------------------------------------------- Assessment and Plan  Acute maxillary sinusitis She is having worsening symptoms with double sickening after having recent URI.  Concern for bacterial sinusitis.  She may continue supportive care however discussed limiting decongestants due to elevated blood pressure.  Adding course of doxycycline as well as prednisone.  Contact clinic if symptoms are not improving or continuing to worsen.   Meds ordered this encounter  Medications   doxycycline (VIBRA-TABS) 100 MG tablet    Sig: Take 1 tablet (100 mg total) by mouth 2 (two) times daily for 10 days.    Dispense:  20 tablet    Refill:  0   predniSONE (DELTASONE) 50 MG tablet    Sig: Take 1 tab po daily x5 days.    Dispense:  5 tablet    Refill:  0    No follow-ups on file.    This visit occurred during the SARS-CoV-2 public health emergency.  Safety protocols were in place, including screening questions prior to the visit, additional usage of staff PPE, and extensive cleaning of exam room while observing appropriate contact time as indicated for disinfecting solutions.

## 2022-02-03 NOTE — Patient Instructions (Signed)

## 2022-02-03 NOTE — Assessment & Plan Note (Signed)
She is having worsening symptoms with double sickening after having recent URI.  Concern for bacterial sinusitis.  She may continue supportive care however discussed limiting decongestants due to elevated blood pressure.  Adding course of doxycycline as well as prednisone.  Contact clinic if symptoms are not improving or continuing to worsen.

## 2022-02-04 ENCOUNTER — Ambulatory Visit: Payer: BC Managed Care – PPO | Admitting: Family Medicine

## 2022-02-28 ENCOUNTER — Other Ambulatory Visit: Payer: Self-pay | Admitting: *Deleted

## 2022-02-28 DIAGNOSIS — F325 Major depressive disorder, single episode, in full remission: Secondary | ICD-10-CM

## 2022-02-28 MED ORDER — FLUOXETINE HCL 10 MG PO CAPS: ORAL_CAPSULE | ORAL | 2 refills | Status: DC

## 2022-05-05 ENCOUNTER — Encounter: Payer: Self-pay | Admitting: Family Medicine

## 2022-05-05 ENCOUNTER — Ambulatory Visit (INDEPENDENT_AMBULATORY_CARE_PROVIDER_SITE_OTHER): Payer: BC Managed Care – PPO | Admitting: Family Medicine

## 2022-05-05 VITALS — BP 176/86 | HR 68 | Ht 68.0 in | Wt 150.0 lb

## 2022-05-05 DIAGNOSIS — R030 Elevated blood-pressure reading, without diagnosis of hypertension: Secondary | ICD-10-CM

## 2022-05-05 DIAGNOSIS — Z0000 Encounter for general adult medical examination without abnormal findings: Secondary | ICD-10-CM | POA: Diagnosis not present

## 2022-05-05 NOTE — Progress Notes (Signed)
Complete physical exam  Patient: Dana [name]   DOB: 17-Apr-1961   61 y.o. Female  MRN: [id number]  Subjective:    Chief Complaint  Patient presents with   Annual Exam    Dana [name] is a 61 y.o. female who presents today for a complete physical exam. She reports consuming a general diet. The patient does not participate in regular exercise at present. She generally feels well. She reports sleeping well. She does not have additional problems to discuss today.    Most recent fall risk assessment:    05/05/2022    2:31 PM  [place] in the past year? 0  Number falls in past yr: 0  Injury with Fall? 0  Risk for fall due to : No Fall Risks  Follow up Falls evaluation completed     Most recent depression screenings:    05/05/2022    2:31 PM 05/17/2021   11:29 AM  PHQ 2/9 Scores  PHQ - 2 Score 0 0        Patient Care Team: Hali Marry, MD as PCP - General   Outpatient Medications Prior to Visit  Medication Sig   Calcium Carbonate-Vit D-Min (CALCIUM 1200 PO) Take 1 tablet by mouth daily.   FLUoxetine (PROZAC) 10 MG capsule TAKE 1 CAPSULE BY MOUTH EVERY DAY   Multiple Vitamins-Minerals (MULTIVITAMIN ADULT PO) Take 1 tablet by mouth daily.   Omega-3 Fatty Acids (FISH OIL) 1000 MG CAPS Take 1 capsule by mouth daily.   [DISCONTINUED] predniSONE (DELTASONE) 50 MG tablet Take 1 tab po daily x5 days.   No facility-administered medications prior to visit.    ROS        Objective:     BP (!) 176/86 (BP Location: Left Arm, Patient Position: Sitting, Cuff Size: Normal)   Pulse 68   Ht '5\' 8"'$  (1.727 m)   Wt 150 lb 0.6 oz (68.1 kg)   SpO2 100%   BMI 22.81 kg/m     Physical Exam Vitals and nursing note reviewed. Exam conducted with a chaperone present.  Constitutional:      Appearance: She is well-developed.  HENT:     Head: Normocephalic and atraumatic.     Right Ear: Tympanic membrane, ear canal and external ear normal.     Left  Ear: Tympanic membrane, ear canal and external ear normal.     Nose: Nose normal.  Eyes:     Conjunctiva/sclera: Conjunctivae normal.     Pupils: Pupils are equal, round, and reactive to light.  Neck:     Thyroid: No thyromegaly.  Cardiovascular:     Rate and Rhythm: Normal rate and regular rhythm.     Heart sounds: Normal heart sounds.  Pulmonary:     Effort: Pulmonary effort is normal.     Breath sounds: Normal breath sounds. No wheezing.  Chest:  Breasts:    Left: Normal.     Comments: Right breast with nipple absents old surgical scar well-healed.  Implant in place no palpable lesions or nodules.  Left breast tissue with no palpable lesions or nodules nipple in place.  No axillary lymphadenopathy. Musculoskeletal:     Cervical back: Neck supple.  Lymphadenopathy:     Cervical: No cervical adenopathy.     Upper Body:     Right upper body: No supraclavicular or axillary adenopathy.     Left upper body: No supraclavicular or axillary adenopathy.  Skin:    General: Skin is warm  and dry.     Coloration: Skin is not pale.     Comments: Slightly hyperkeratotic white lesion on the right forearm near the old excision scar site  Neurological:     Mental Status: She is alert and oriented to person, place, and time.  Psychiatric:        Behavior: Behavior normal.      No results found for any visits on 05/05/22.     Assessment & Plan:    Routine Health Maintenance and Physical Exam  Immunization History  Administered Date(s) Administered   COVID-19, mRNA, vaccine(Comirnaty)12 years and older 04/21/2022   Influenza Split 04/04/2012   Influenza Whole 03/13/2009, 04/17/2010   Influenza,inj,Quad PF,6+ Mos 03/17/2014, 03/03/2018, 04/04/2019, 05/17/2021   Influenza,inj,Quad PF,6-35 Mos 04/15/2020   Influenza-Unspecified 04/04/2012, 03/17/2014, 03/28/2016, 02/03/2017, 03/03/2018, 04/21/2022   Moderna SARS-COV2 Booster Vaccination 03/18/2021   Moderna Sars-Covid-2 Vaccination  10/20/2019, 11/10/2019, 04/15/2020, 04/21/2022   Td 04/16/2001   Tdap 07/10/2011   Zoster Recombinat (Shingrix) 03/03/2018, 05/17/2018   Zoster, Unspecified 03/03/2018, 05/17/2018    Health Maintenance  Topic Date Due   COVID-19 Vaccine (5 - Moderna series) 06/16/2022   MAMMOGRAM  06/20/2023   COLONOSCOPY (Pts 45-9yr Insurance coverage will need to be confirmed)  08/21/2024   PAP SMEAR-Modifier  05/17/2026   INFLUENZA VACCINE  Completed   Hepatitis C Screening  Completed   HIV Screening  Completed   Zoster Vaccines- Shingrix  Completed   Pneumococcal Vaccine 144640Years old  Aged Out   HPV VACCINES  Aged Out    Discussed health benefits of physical activity, and encouraged her to engage in regular exercise appropriate for her age and condition.  Problem List Items Addressed This Visit   None Visit Diagnoses     Wellness examination    -  Primary   Relevant Orders   Lipid Panel w/reflex Direct LDL   COMPLETE METABOLIC PANEL WITH GFR   CBC   Elevated BP without diagnosis of hypertension         Keep up a regular exercise program and make sure you are eating a healthy diet Try to eat 4 servings of dairy a day, or if you are lactose intolerant take a calcium with vitamin D daily.  Your vaccines are up to date.  Repeat blood pressure elevated today so plan to return in 2 weeks for nurse visit to recheck blood pressure.  It was little high the last time she was here but she was on cold medication at that time.  This time she has not been taking any decongestants or NSAIDs.  Return in about 1 year (around 05/06/2023) for Wellness Exam.     CBeatrice Lecher MD

## 2022-05-06 ENCOUNTER — Ambulatory Visit (INDEPENDENT_AMBULATORY_CARE_PROVIDER_SITE_OTHER): Payer: BC Managed Care – PPO | Admitting: Family Medicine

## 2022-05-06 ENCOUNTER — Ambulatory Visit: Payer: BC Managed Care – PPO

## 2022-05-06 VITALS — BP 146/82 | HR 70 | Ht 68.0 in | Wt 150.0 lb

## 2022-05-06 DIAGNOSIS — R030 Elevated blood-pressure reading, without diagnosis of hypertension: Secondary | ICD-10-CM | POA: Diagnosis not present

## 2022-05-06 LAB — COMPLETE METABOLIC PANEL WITH GFR
AG Ratio: 1.6 (calc) (ref 1.0–2.5)
ALT: 17 U/L (ref 6–29)
AST: 23 U/L (ref 10–35)
Albumin: 4.9 g/dL (ref 3.6–5.1)
Alkaline phosphatase (APISO): 57 U/L (ref 37–153)
BUN: 11 mg/dL (ref 7–25)
CO2: 29 mmol/L (ref 20–32)
Calcium: 9.6 mg/dL (ref 8.6–10.4)
Chloride: 99 mmol/L (ref 98–110)
Creat: 0.62 mg/dL (ref 0.50–1.05)
Globulin: 3.1 g/dL (calc) (ref 1.9–3.7)
Glucose, Bld: 91 mg/dL (ref 65–99)
Potassium: 4.9 mmol/L (ref 3.5–5.3)
Sodium: 138 mmol/L (ref 135–146)
Total Bilirubin: 0.8 mg/dL (ref 0.2–1.2)
Total Protein: 8 g/dL (ref 6.1–8.1)
eGFR: 101 mL/min/{1.73_m2} (ref >=60)

## 2022-05-06 LAB — CBC
HCT: 40.4 % (ref 35.0–45.0)
Hemoglobin: 14 g/dL (ref 11.7–15.5)
MCH: 32.1 pg (ref 27.0–33.0)
MCHC: 34.7 g/dL (ref 32.0–36.0)
MCV: 92.7 fL (ref 80.0–100.0)
MPV: 11.1 fL (ref 7.5–12.5)
Platelets: 321 10*3/uL (ref 140–400)
RBC: 4.36 10*6/uL (ref 3.80–5.10)
RDW: 11.5 % (ref 11.0–15.0)
WBC: 6.4 10*3/uL (ref 3.8–10.8)

## 2022-05-06 LAB — LIPID PANEL W/REFLEX DIRECT LDL
Cholesterol: 282 mg/dL — ABNORMAL HIGH (ref <200)
HDL: 105 mg/dL (ref >=50)
LDL Cholesterol (Calc): 163 mg/dL (calc) — ABNORMAL HIGH
Non-HDL Cholesterol (Calc): 177 mg/dL (calc) — ABNORMAL HIGH (ref <130)
Total CHOL/HDL Ratio: 2.7 (calc) (ref <5.0)
Triglycerides: 51 mg/dL (ref <150)

## 2022-05-06 NOTE — Progress Notes (Signed)
[name], LDL cholesterol jumped up quite a bit compared to last year.  Just really encouraged her to work on healthy diet and regular exercise.  Metabolic panel is normal.  White count looks great no sign of anemia.  We will see back in a couple weeks for nurse visit to recheck your blood pressure.  Just work on low-salt diet there is some great information on the DASH diet.  It stands for dietary approach to stopping hypertension.  If you go to DASH.gov it has a lot of helpful information

## 2022-05-06 NOTE — Progress Notes (Signed)
   Established Patient Office Visit  Subjective   Patient ID: Dana [name], female    DOB: [birth year]  Age: 61 y.o. MRN: [id number]  Chief Complaint  Patient presents with   Blood Pressure Check    Nurse visit     HPI  BP check- nurse visit - patient brought in form for completion - biometric screening form.   ROS    Objective:     BP (!) 146/82   Pulse 70   Ht '5\' 8"'$  (1.727 m)   Wt 150 lb (68 kg)   SpO2 99%   BMI 22.81 kg/m    Physical Exam   No results found for any visits on 05/06/22.    The ASCVD Risk score (Arnett DK, et al., 2019) failed to calculate for the following reasons:   The valid HDL cholesterol range is 20 to 100 mg/dL    Assessment & Plan:  BP check - initial reading = 149/92 second reading= 146/82- form completed and placed in Dr. Gardiner Ramus box for signature before we fax it. Patient scheduled for a 2 week nurse visit follow up for BP .  Problem List Items Addressed This Visit       Other   Elevated BP without diagnosis of hypertension - Primary    Return in about 2 weeks (around 05/20/2022) for nurse visit for BP check.    Rae Lips, LPN

## 2022-05-06 NOTE — Progress Notes (Signed)
Completed and signed and given to patient.

## 2022-05-06 NOTE — Patient Instructions (Signed)
Return in 2 weeks for nurse visit for BP Check =kph

## 2022-05-12 ENCOUNTER — Ambulatory Visit: Payer: BC Managed Care – PPO

## 2022-05-12 ENCOUNTER — Encounter: Payer: BC Managed Care – PPO | Admitting: Family Medicine

## 2022-05-12 ENCOUNTER — Ambulatory Visit: Payer: Self-pay | Admitting: Licensed Clinical Social Worker

## 2022-05-12 NOTE — Patient Outreach (Signed)
  Care Coordination   Initial Visit Note   05/12/2022 Name: Dana [name] MRN: [id number] DOB: 21-Apr-1961  Dana [name] is a 61 y.o. year old female who sees Metheney, Rene Kocher, MD for primary care. I spoke with  Blima Singer by phone today.  What matters to the patients health and wellness today?  Appointment scheduled to review care coordination services   SDOH assessments and interventions completed:  No   Care Coordination Interventions:  No, not indicated   Follow up plan: Follow up call scheduled for 06/13/22    Encounter Outcome:  Pt. Visit Completed   Casimer Lanius, [place] [phone]

## 2022-05-12 NOTE — Patient Instructions (Signed)
    It was a pleasure speaking with you today. Per your request a Care Coordination phone appointment is scheduled 11/29/ [place], [place] [phone]

## 2022-05-14 ENCOUNTER — Ambulatory Visit: Payer: BC Managed Care – PPO

## 2022-05-14 ENCOUNTER — Ambulatory Visit: Payer: Self-pay | Admitting: Licensed Clinical Social Worker

## 2022-05-14 NOTE — Patient Outreach (Signed)
  Care Coordination  Initial Visit Note   05/14/2022 Name: LAKEESHA FONTANILLA MRN: [id number] DOB: 04/16/1961  LEONTYNE MANVILLE is a 61 y.o. year old female who sees Metheney, Rene Kocher, MD for primary care. I spoke with  Blima Singer by phone today.  What matters to the patients health and wellness today?  Education on blood pressure    Goals Addressed             This Visit's Progress    COMPLETED: Care Coordination Activies No Follow up Required for LCSW       Care Coordination Interventions: Reviewed Care Coordination Services:would like to participate Assessed Social Determinants of Health Made referral to RN Care Coordinator : for assistance with managing blood pressure Reviewed all upcoming appointments in Epic system            SDOH assessments and interventions completed:  Yes  SDOH Interventions Today    Flowsheet Row Most Recent Value  SDOH Interventions   Food Insecurity Interventions Intervention Not Indicated  Housing Interventions Intervention Not Indicated  Transportation Interventions Intervention Not Indicated  Financial Strain Interventions Intervention Not Indicated        Care Coordination Interventions:  Yes, provided   Follow up plan:  No further intervention required by LCSW Referral made to RN care coordinator   Encounter Outcome:  Pt. Visit Completed   Casimer Lanius, [place] [phone]

## 2022-05-14 NOTE — Patient Instructions (Addendum)
Visit Information  Thank you for taking time to visit with me today. Please don't hesitate to contact me if I can be of assistance to you.   Following are the goals we discussed today:   Goals Addressed             This Visit's Progress    COMPLETED: Care Coordination Activies No Follow up Required for LCSW       Care Coordination Interventions: Reviewed Care Coordination Services:would like to participate Assessed Social Determinants of Health Made referral to RN Care Coordinator : for assistance with managing blood pressure Reviewed all upcoming appointments in Epic system           Patient verbalizes understanding of instructions and care plan provided today and agrees to view in [place]. Active MyChart status and patient understanding of how to access instructions and care plan via MyChart confirmed with patient.       The care guide will call you to schedule the phone appointment with the RN care coordinator.  Care Coordination provides support specific to your health needs that extend beyond exceptional routine office care you already receive from your primary care doctor.    If you are eligible for standard Care Coordination, there is no cost to you.  The Care Coordination team is made up of the following team members: Registered Nurse Care Guide: disease management, health education, care coordination and complex case management Clinical Social Work: Complex Care Coordination including coordination of level of care needs, mental and behavioral health assessment and recommendations, and connection to long-term mental health support Clinical Pharmacist: medication management, assistance and disease management Community Resource Care Guides: Forensic psychologist Team: dedicated team of scheduling professionals to support patient and clinical team scheduling needs  Please call [phone] if you would like to schedule a phone appointment with one of the  team members.   Casimer Lanius, [place] [phone]

## 2022-05-15 ENCOUNTER — Encounter: Payer: BC Managed Care – PPO | Admitting: Family Medicine

## 2022-05-15 ENCOUNTER — Telehealth: Payer: Self-pay | Admitting: *Deleted

## 2022-05-15 NOTE — Progress Notes (Signed)
  Care Coordination Note  05/15/2022 Name: Dana [name] MRN: [id number] DOB: 20-Mar-1961  Dana [name] is a 61 y.o. year old female who is a primary care patient of Hali Marry, MD and is actively engaged with the care management team. I reached out to Blima Singer by phone today to assist with scheduling an initial visit with the RN Case Manager  Follow up plan: Unsuccessful telephone outreach attempt made. A HIPAA compliant phone message was left for the patient providing contact information and requesting a return call.   Julian Hy, [place] Direct Dial: [phone]

## 2022-05-19 ENCOUNTER — Encounter: Payer: BC Managed Care – PPO | Admitting: Family Medicine

## 2022-05-19 NOTE — Progress Notes (Signed)
  Care Coordination Note  05/19/2022 Name: Dana [name] MRN: [id number] DOB: 15-Nov-1960  [name] is a 61 y.o. year old female who is a primary care patient of Hali Marry, MD and is actively engaged with the care management team. I reached out to Blima Singer by phone today to assist with scheduling an initial visit with the RN Case Manager  Follow up plan: Telephone appointment with care management team member scheduled for: 05/30/2022  Julian Hy, [place] Direct Dial: [phone]

## 2022-05-20 ENCOUNTER — Ambulatory Visit (INDEPENDENT_AMBULATORY_CARE_PROVIDER_SITE_OTHER): Payer: BC Managed Care – PPO | Admitting: Family Medicine

## 2022-05-20 VITALS — BP 128/67 | HR 64 | Ht 68.0 in | Wt 151.0 lb

## 2022-05-20 DIAGNOSIS — R030 Elevated blood-pressure reading, without diagnosis of hypertension: Secondary | ICD-10-CM

## 2022-05-20 NOTE — Progress Notes (Signed)
Agree with documentation as above.   Braxston Quinter, MD  

## 2022-05-20 NOTE — Progress Notes (Signed)
   Subjective:    Patient ID: Dana [name], female    DOB: [birth year], 61 y.o.   MRN: [id number]  HPI Patient present today for a follow up on her blood pressure. Denies trouble sleeping, palpitations or medications problems. She brought her machine which gave an initial reading of 145/94. After raising her arm to heart level her BP was retaken with her machine and gave a reading of 124/80.    Review of Systems     Objective:   Physical Exam        Assessment & Plan:  Patient advised to make sure to sit for 10-15 minutes prior to taking her BP readings and make sure the arm is at heart level. Patient will keep her next scheduled follow up with Dr. Madilyn Fireman.

## 2022-05-30 ENCOUNTER — Ambulatory Visit: Payer: Self-pay

## 2022-05-30 NOTE — Patient Instructions (Addendum)
Visit Information  Thank you for taking time to visit with me today. Please don't hesitate to contact me if I can be of assistance to you.   Following are the goals we discussed today:   Goals Addressed             This Visit's Progress    Cholesterol and Blood pressure Education       Care Coordination Interventions: Discussed blood pressure readings encouraged to continue to monitor and monitor for events that may raise blood pressure.  Patient reports she has parameters established by PCP. Encouraged patient to notify provider if blood pressure is outside of recommended range Discussed foods that can help lower cholesterol such as oats, nuts, beans Discussed foods that can raise cholesterol such as red meats Encouraged to heat healthy. Discussed stress relieving techniques such as deep breathing, also reinforced that LCSW on the team could assist with providing strategies. Encouraged patient to notify RNCM if she would like to speak with the LCSW. Provided education: Checking your blood pressure at home; Lipid test; low cholesterol, saturated fats and transfats; DASH diet        Our next appointment is by telephone on 06/27/22 at 12:00pm  Please call the care guide team at [phone] if you need to cancel or reschedule your appointment.   If you are experiencing a Mental Health or [place] or need someone to talk to, please call the Suicide and Crisis Lifeline: [place], RN, MSN, BSN, CCM [place] [phone]  Low-Sodium Eating Plan Sodium, which is an element that makes up salt, helps you maintain a healthy balance of fluids in your body. Too much sodium can increase your blood pressure and cause fluid and waste to be held in your body. Your health care provider or dietitian may recommend following this plan if you have high blood pressure (hypertension), kidney disease, liver disease, or heart failure. Eating less sodium can help  lower your blood pressure, reduce swelling, and protect your heart, liver, and kidneys. What are tips for following this plan? Reading food labels The Nutrition Facts label lists the amount of sodium in one serving of the food. If you eat more than one serving, you must multiply the listed amount of sodium by the number of servings. Choose foods with less than 140 mg of sodium per serving. Avoid foods with 300 mg of sodium or more per serving. Shopping  Look for lower-sodium products, often labeled as "low-sodium" or "no salt added." Always check the sodium content, even if foods are labeled as "unsalted" or "no salt added." Buy fresh foods. Avoid canned foods and pre-made or frozen meals. Avoid canned, cured, or processed meats. Buy breads that have less than 80 mg of sodium per slice. Cooking  Eat more home-cooked food and less restaurant, buffet, and fast food. Avoid adding salt when cooking. Use salt-free seasonings or herbs instead of table salt or sea salt. Check with your health care provider or pharmacist before using salt substitutes. Cook with plant-based oils, such as canola, sunflower, or olive oil. Meal planning When eating at a restaurant, ask that your food be prepared with less salt or no salt, if possible. Avoid dishes labeled as brined, pickled, cured, smoked, or made with soy sauce, miso, or teriyaki sauce. Avoid foods that contain MSG (monosodium glutamate). MSG is sometimes added to [place] food, bouillon, and some canned foods. Make meals that can be grilled, baked, poached, roasted, or steamed. These are generally  made with less sodium. General information Most people on this plan should limit their sodium intake to 1,500-2,000 mg (milligrams) of sodium each day. What foods should I eat? Fruits Fresh, frozen, or canned fruit. Fruit juice. Vegetables Fresh or frozen vegetables. "No salt added" canned vegetables. "No salt added" tomato sauce and paste. Low-sodium or  reduced-sodium tomato and vegetable juice. Grains Low-sodium cereals, including oats, puffed wheat and rice, and shredded wheat. Low-sodium crackers. Unsalted rice. Unsalted pasta. Low-sodium bread. Whole-grain breads and whole-grain pasta. Meats and other proteins Fresh or frozen (no salt added) meat, poultry, seafood, and fish. Low-sodium canned tuna and salmon. Unsalted nuts. Dried peas, beans, and lentils without added salt. Unsalted canned beans. Eggs. Unsalted nut butters. Dairy Milk. Soy milk. Cheese that is naturally low in sodium, such as ricotta cheese, fresh mozzarella, or Swiss cheese. Low-sodium or reduced-sodium cheese. Cream cheese. Yogurt. Seasonings and condiments Fresh and dried herbs and spices. Salt-free seasonings. Low-sodium mustard and ketchup. Sodium-free salad dressing. Sodium-free light mayonnaise. Fresh or refrigerated horseradish. Lemon juice. Vinegar. Other foods Homemade, reduced-sodium, or low-sodium soups. Unsalted popcorn and pretzels. Low-salt or salt-free chips. The items listed above may not be a complete list of foods and beverages you can eat. Contact a dietitian for more information. What foods should I avoid? Vegetables Sauerkraut, pickled vegetables, and relishes. Olives. [place] fries. Onion rings. Regular canned vegetables (not low-sodium or reduced-sodium). Regular canned tomato sauce and paste (not low-sodium or reduced-sodium). Regular tomato and vegetable juice (not low-sodium or reduced-sodium). Frozen vegetables in sauces. Grains Instant hot cereals. Bread stuffing, pancake, and biscuit mixes. Croutons. Seasoned rice or pasta mixes. Noodle soup cups. Boxed or frozen macaroni and cheese. Regular salted crackers. Self-rising flour. Meats and other proteins Meat or fish that is salted, canned, smoked, spiced, or pickled. Precooked or cured meat, such as sausages or meat loaves. [place] Salines. Ham. Pepperoni. Hot dogs. Corned beef. Chipped beef. Salt pork. Jerky.  Pickled herring. Anchovies and sardines. Regular canned tuna. Salted nuts. Dairy Processed cheese and cheese spreads. Hard cheeses. Cheese curds. Blue cheese. Feta cheese. String cheese. Regular cottage cheese. Buttermilk. Canned milk. Fats and oils Salted butter. Regular margarine. Ghee. Bacon fat. Seasonings and condiments Onion salt, garlic salt, seasoned salt, table salt, and sea salt. Canned and packaged gravies. Worcestershire sauce. Tartar sauce. Barbecue sauce. Teriyaki sauce. Soy sauce, including reduced-sodium. Steak sauce. Fish sauce. Oyster sauce. Cocktail sauce. Horseradish that you find on the shelf. Regular ketchup and mustard. Meat flavorings and tenderizers. Bouillon cubes. Hot sauce. Pre-made or packaged marinades. Pre-made or packaged taco seasonings. Relishes. Regular salad dressings. Salsa. Other foods Salted popcorn and pretzels. Corn chips and puffs. Potato and tortilla chips. Canned or dried soups. Pizza. Frozen entrees and pot pies. The items listed above may not be a complete list of foods and beverages you should avoid. Contact a dietitian for more information. Summary Eating less sodium can help lower your blood pressure, reduce swelling, and protect your heart, liver, and kidneys. Most people on this plan should limit their sodium intake to 1,500-2,000 mg (milligrams) of sodium each day. Canned, boxed, and frozen foods are high in sodium. Restaurant foods, fast foods, and pizza are also very high in sodium. You also get sodium by adding salt to food. Try to cook at home, eat more fresh fruits and vegetables, and eat less fast food and canned, processed, or prepared foods. This information is not intended to replace advice given to you by your health care provider. Make sure you discuss  any questions you have with your health care provider. Document Revised: 07/08/2019 Document Reviewed: 05/04/2019 Elsevier Patient Education  [place].

## 2022-05-30 NOTE — Patient Outreach (Signed)
  Care Coordination   Follow Up Visit Note   05/30/2022 Name: Dana [name] MRN: [id number] DOB: Nov 08, 1960  Dana [name] is a 61 y.o. year old female who sees Metheney, Rene Kocher, MD for primary care. I spoke with  Blima Singer by phone today.  What matters to the patients health and wellness today?  Dana [name] reports that she had gone to a provider visit and it was elevated she states she then she has been checking her blood pressure and had a follow up visit with PCP and BP WNL. She expresses one episode where was really heated and took her blood pressure and she reports she took her blood pressure after that event and it was 199/103, but she reports the blood pressure came down. She reports it was a one time event and has not happened since. She would like education regarding lowering cholesterol and LDL's as well as diet education regarding HTN.   Goals Addressed             This Visit's Progress    Cholesterol and Blood pressure Education       Care Coordination Interventions: Discussed blood pressure readings encouraged to continue to monitor and monitor for events that may raise blood pressure.  Patient reports she has parameters established by PCP. Encouraged patient to notify provider if blood pressure is outside of recommended range Discussed foods that can help lower cholesterol such as oats, nuts, beans Discussed foods that can raise cholesterol such as red meats Encouraged to heat healthy. Discussed stress relieving techniques such as deep breathing, also reinforced that LCSW on the team could assist with providing strategies. Encouraged patient to notify RNCM if she would like to speak with the LCSW. Provided education: Checking your blood pressure at home; Lipid test; low cholesterol, saturated fats and transfats; DASH diet        SDOH assessments and interventions completed:  No completed by LCSW.   Care Coordination Interventions:  Yes, provided    Follow up plan: Follow up call scheduled for 06/27/22    Encounter Outcome:  Pt. Visit Completed   Thea Silversmith, RN, MSN, BSN, [place] Coordinator [phone]

## 2022-06-06 ENCOUNTER — Other Ambulatory Visit: Payer: Self-pay | Admitting: Family Medicine

## 2022-06-06 DIAGNOSIS — Z1231 Encounter for screening mammogram for malignant neoplasm of breast: Secondary | ICD-10-CM

## 2022-06-26 DIAGNOSIS — L814 Other melanin hyperpigmentation: Secondary | ICD-10-CM | POA: Diagnosis not present

## 2022-06-26 DIAGNOSIS — D1801 Hemangioma of skin and subcutaneous tissue: Secondary | ICD-10-CM | POA: Diagnosis not present

## 2022-06-26 DIAGNOSIS — L821 Other seborrheic keratosis: Secondary | ICD-10-CM | POA: Diagnosis not present

## 2022-06-26 DIAGNOSIS — L815 Leukoderma, not elsewhere classified: Secondary | ICD-10-CM | POA: Diagnosis not present

## 2022-06-27 ENCOUNTER — Telehealth: Payer: Self-pay

## 2022-06-27 NOTE — Patient Outreach (Signed)
  Care Coordination   06/27/2022 Name: SAKIYA STEPKA MRN: [id number] DOB: [birth year]   Care Coordination Outreach Attempts:  An unsuccessful telephone outreach was attempted for a scheduled appointment today.  Follow Up Plan:  Additional outreach attempts will be made to offer the patient care coordination information and services.   Encounter Outcome:  No Answer   Care Coordination Interventions:  No, not indicated    Thea Silversmith, RN, MSN, BSN, [place] Coordinator [phone]

## 2022-07-02 ENCOUNTER — Ambulatory Visit: Payer: BC Managed Care – PPO

## 2022-07-02 ENCOUNTER — Telehealth: Payer: Self-pay | Admitting: *Deleted

## 2022-07-02 NOTE — Progress Notes (Signed)
  Care Coordination Note  07/02/2022 Name: Dana [name] MRN: [id number] DOB: 13-Sep-1960  Dana [name] is a 62 y.o. year old female who is a primary care patient of Hali Marry, MD and is actively engaged with the care management team. I reached out to Blima Singer by phone today to assist with re-scheduling a follow up visit with the RN Case Manager  Follow up plan: Unsuccessful telephone outreach attempt made. A HIPAA compliant phone message was left for the patient providing contact information and requesting a return call.   Julian Hy, [place] Direct Dial: [phone]

## 2022-07-29 NOTE — Progress Notes (Signed)
  Care Coordination Note  07/29/2022 Name: TERIE LEAR MRN: [id number] DOB: 02/09/1961  KATHARIN SCHNEIDER is a 61 y.o. year old female who is a primary care patient of Hali Marry, MD and is actively engaged with the care management team. I reached out to Blima Singer by phone today to assist with re-scheduling a follow up visit with the RN Case Manager  Follow up plan: We have been unable to make contact with the patient for follow up.   Julian Hy, [place] Direct Dial: [phone]

## 2022-08-06 ENCOUNTER — Ambulatory Visit (INDEPENDENT_AMBULATORY_CARE_PROVIDER_SITE_OTHER): Payer: BC Managed Care – PPO

## 2022-08-06 DIAGNOSIS — Z1231 Encounter for screening mammogram for malignant neoplasm of breast: Secondary | ICD-10-CM | POA: Diagnosis not present

## 2022-08-11 NOTE — Progress Notes (Signed)
Please call patient. Normal mammogram.  Repeat in 1 year.  

## 2022-08-25 ENCOUNTER — Ambulatory Visit: Payer: Self-pay

## 2022-08-25 NOTE — Patient Outreach (Addendum)
  Care Coordination   Case closure  Visit Note   08/25/2022 Name: Dana [name] MRN: [id number] DOB: December 31, 1960  Dana [name] is a 62 y.o. year old female who sees Metheney, Rene Kocher, MD for primary care.   RNCM received message from care guide-unable to reach patient to reschedule telephone call. Case closed.   Goals Addressed             This Visit's Progress    COMPLETED: Cholesterol and Blood pressure Education       Goals closed due to unable to maintain contact with patient. Care Coordination Interventions: Discussed blood pressure readings encouraged to continue to monitor and monitor for events that may raise blood pressure.  Patient reports she has parameters established by PCP. Encouraged patient to notify provider if blood pressure is outside of recommended range Discussed foods that can help lower cholesterol such as oats, nuts, beans Discussed foods that can raise cholesterol such as red meats Encouraged to heat healthy. Discussed stress relieving techniques such as deep breathing, also reinforced that LCSW on the team could assist with providing strategies. Encouraged patient to notify RNCM if she would like to speak with the LCSW. Provided education: Checking your blood pressure at home; Lipid test; low cholesterol, saturated fats and transfats; DASH diet        SDOH assessments and interventions completed:  No  Care Coordination Interventions:  No, not indicated   Follow up plan: No further intervention required.   Encounter Outcome:  Pt. Visit Completed   Thea Silversmith, RN, MSN, BSN, [place] Coordinator [phone]

## 2022-11-03 ENCOUNTER — Other Ambulatory Visit: Payer: Self-pay | Admitting: Family Medicine

## 2022-11-03 DIAGNOSIS — F325 Major depressive disorder, single episode, in full remission: Secondary | ICD-10-CM

## 2023-04-15 ENCOUNTER — Encounter: Payer: Self-pay | Admitting: Family Medicine

## 2023-04-15 ENCOUNTER — Ambulatory Visit (INDEPENDENT_AMBULATORY_CARE_PROVIDER_SITE_OTHER): Payer: BC Managed Care – PPO | Admitting: Family Medicine

## 2023-04-15 VITALS — BP 118/63 | HR 64 | Ht 68.0 in | Wt 151.0 lb

## 2023-04-15 DIAGNOSIS — F520 Hypoactive sexual desire disorder: Secondary | ICD-10-CM | POA: Diagnosis not present

## 2023-04-15 DIAGNOSIS — Z0000 Encounter for general adult medical examination without abnormal findings: Secondary | ICD-10-CM | POA: Diagnosis not present

## 2023-04-15 DIAGNOSIS — Z23 Encounter for immunization: Secondary | ICD-10-CM | POA: Diagnosis not present

## 2023-04-15 MED ORDER — ADDYI 100 MG PO TABS: 100.0000 mg | ORAL_TABLET | Freq: Every day | ORAL | 3 refills | Status: DC

## 2023-04-15 NOTE — Progress Notes (Signed)
Complete physical exam  Patient: Dana [name]   DOB: Jan 06, 1961   62 y.o. Female  MRN: [id number]  Subjective:    Chief Complaint  Patient presents with   Annual Exam    Dana [name] is a 62 y.o. female who presents today for a complete physical exam. She reports consuming a general diet. The patient does not participate in regular exercise at present. She generally feels well. She reports sleeping fairly well. She does not have additional problems to discuss today.    Most recent fall risk assessment:    05/05/2022    2:31 PM  Fall Risk   Falls in the past year? 0  Number falls in past yr: 0  Injury with Fall? 0  Risk for fall due to : No Fall Risks  Follow up Falls evaluation completed     Most recent depression screenings:    04/15/2023    8:38 AM 05/05/2022    2:31 PM  PHQ 2/9 Scores  PHQ - 2 Score 0 0        Patient Care Team: Agapito Games, MD as PCP - General (Family Medicine)   Outpatient Medications Prior to Visit  Medication Sig   Calcium Carbonate-Vit D-Min (CALCIUM 1200 PO) Take 1 tablet by mouth daily.   Multiple Vitamins-Minerals (MULTIVITAMIN ADULT PO) Take 1 tablet by mouth daily.   Omega-3 Fatty Acids (FISH OIL) 1000 MG CAPS Take 1 capsule by mouth daily.   [DISCONTINUED] FLUoxetine (PROZAC) 10 MG capsule TAKE 1 CAPSULE DAILY   No facility-administered medications prior to visit.    ROS        Objective:     BP 118/63   Pulse 64   Ht 5\' 8"  (1.727 m)   Wt 151 lb (68.5 kg)   SpO2 100%   BMI 22.96 kg/m    Physical Exam Exam conducted with a chaperone present.  Chest:     Chest wall: No mass.  Breasts:    Right: Normal. No mass, nipple discharge or skin change.     Left: Normal. No mass, nipple discharge or skin change.  Lymphadenopathy:     Upper Body:     Right upper body: No supraclavicular, axillary or pectoral adenopathy.     Left upper body: No supraclavicular, axillary or pectoral adenopathy.       No results found for any visits on 04/15/23.     Assessment & Plan:    Routine Health Maintenance and Physical Exam  Immunization History  Administered Date(s) Administered   Influenza Split 04/04/2012   Influenza Whole 03/13/2009, 04/17/2010   Influenza, Seasonal, Injecte, Preservative Fre 04/15/2023   Influenza,inj,Quad PF,6+ Mos 03/17/2014, 03/03/2018, 04/04/2019, 05/17/2021   Influenza,inj,Quad PF,6-35 Mos 04/15/2020   Influenza-Unspecified 04/04/2012, 03/17/2014, 03/28/2016, 02/03/2017, 03/03/2018, 04/21/2022   Moderna SARS-COV2 Booster Vaccination 03/18/2021   Moderna Sars-Covid-2 Vaccination 10/20/2019, 11/10/2019, 04/15/2020, 04/21/2022   Pfizer(Comirnaty)Fall Seasonal Vaccine 12 years and older 04/21/2022   Td 04/16/2001   Tdap 07/10/2011, 04/15/2023   Zoster Recombinant(Shingrix) 03/03/2018, 05/17/2018   Zoster, Unspecified 03/03/2018, 05/17/2018    Health Maintenance  Topic Date Due   COVID-19 Vaccine (5 - 2023-24 season) 02/15/2023   MAMMOGRAM  08/06/2024   Colonoscopy  08/21/2024   Cervical Cancer Screening (HPV/Pap Cotest)  05/17/2026   DTaP/Tdap/Td (4 - Td or Tdap) 04/14/2033   INFLUENZA VACCINE  Completed   Hepatitis C Screening  Completed   HIV Screening  Completed   Zoster Vaccines- Shingrix  Completed  Pneumococcal Vaccine 42-24 Years old  Aged Out   HPV VACCINES  Aged Out    Discussed health benefits of physical activity, and encouraged her to engage in regular exercise appropriate for her age and condition.  Problem List Items Addressed This Visit   None Visit Diagnoses     Wellness examination    -  Primary   Relevant Orders   CMP14+EGFR   Lipid panel   CBC   Hemoglobin A1c   Encounter for immunization       Relevant Orders   Tdap vaccine greater than or equal to 7yo IM (Completed)   Flu vaccine trivalent PF, 6mos and older(Flulaval,Afluria,Fluarix,Fluzone) (Completed)   Hypoactive sexual desire disorder       Relevant Medications    Flibanserin (ADDYI) 100 MG TABS      Return in about 1 year (around 04/14/2024) for Wellness Exam.  Keep up a regular exercise program and make sure you are eating a healthy diet Try to eat 4 servings of dairy a day, or if you are lactose intolerant take a calcium with vitamin D daily.  Your vaccines are up to date.      Nani Gasser, MD

## 2023-04-16 LAB — CMP14+EGFR
ALT: 22 [IU]/L (ref 0–32)
AST: 25 [IU]/L (ref 0–40)
Albumin: 4.7 g/dL (ref 3.9–4.9)
Alkaline Phosphatase: 56 [IU]/L (ref 44–121)
BUN/Creatinine Ratio: 15 (ref 12–28)
BUN: 9 mg/dL (ref 8–27)
Bilirubin Total: 0.3 mg/dL (ref 0.0–1.2)
CO2: 24 mmol/L (ref 20–29)
Calcium: 9.8 mg/dL (ref 8.7–10.3)
Chloride: 101 mmol/L (ref 96–106)
Creatinine, Ser: 0.61 mg/dL (ref 0.57–1.00)
Globulin, Total: 2.1 g/dL (ref 1.5–4.5)
Glucose: 89 mg/dL (ref 70–99)
Potassium: 4.5 mmol/L (ref 3.5–5.2)
Sodium: 141 mmol/L (ref 134–144)
Total Protein: 6.8 g/dL (ref 6.0–8.5)
eGFR: 101 mL/min/{1.73_m2} (ref >59)

## 2023-04-16 LAB — HEMOGLOBIN A1C
Est. average glucose Bld gHb Est-mCnc: 126 mg/dL
Hgb A1c MFr Bld: 6 % — ABNORMAL HIGH (ref 4.8–5.6)

## 2023-04-16 LAB — LIPID PANEL
Chol/HDL Ratio: 3.1 ratio (ref 0.0–4.4)
Cholesterol, Total: 238 mg/dL — ABNORMAL HIGH (ref 100–199)
HDL: 78 mg/dL (ref >39)
LDL Chol Calc (NIH): 151 mg/dL — ABNORMAL HIGH (ref 0–99)
Triglycerides: 55 mg/dL (ref 0–149)
VLDL Cholesterol Cal: 9 mg/dL (ref 5–40)

## 2023-04-16 LAB — CBC
Hematocrit: 42.2 % (ref 34.0–46.6)
Hemoglobin: 13.6 g/dL (ref 11.1–15.9)
MCH: 32.2 pg (ref 26.6–33.0)
MCHC: 32.2 g/dL (ref 31.5–35.7)
MCV: 100 fL — ABNORMAL HIGH (ref 79–97)
Platelets: 327 10*3/uL (ref 150–450)
RBC: 4.23 x10E6/uL (ref 3.77–5.28)
RDW: 11.7 % (ref 11.7–15.4)
WBC: 4.1 10*3/uL (ref 3.4–10.8)

## 2023-04-17 NOTE — Progress Notes (Signed)
Hi Dana [name], total cholesterol and LDL are elevated.  You have good HDL cholesterol which is great.  Blood count is within normal limits except the size of your red blood cells which are just a little bit on the larger side.  Will going to call the lab and see if they could add a B12 and folate level..  A1c is up to 6.0 and that prediabetes range so definitely want to work on healthy diet and cutting back on sweets and carbs and plan to recheck that in 6 months.  Your metabolic panel looks great.  Please call lab and see if we can add a B12 and folate level.

## 2023-05-07 ENCOUNTER — Encounter: Payer: BC Managed Care – PPO | Admitting: Family Medicine

## 2023-07-08 ENCOUNTER — Encounter: Payer: Self-pay | Admitting: Family Medicine

## 2023-07-08 ENCOUNTER — Other Ambulatory Visit: Payer: Self-pay | Admitting: Family Medicine

## 2023-07-08 DIAGNOSIS — Z1231 Encounter for screening mammogram for malignant neoplasm of breast: Secondary | ICD-10-CM

## 2023-08-03 ENCOUNTER — Ambulatory Visit
Admission: EM | Admit: 2023-08-03 | Discharge: 2023-08-03 | Disposition: A | Discharge status: Home / Self Care | Payer: BC Managed Care – PPO | Attending: Family Medicine | Admitting: Family Medicine

## 2023-08-03 DIAGNOSIS — J029 Acute pharyngitis, unspecified: Secondary | ICD-10-CM

## 2023-08-03 MED ORDER — AZITHROMYCIN 250 MG PO TABS: 250.0000 mg | ORAL_TABLET | Freq: Every day | ORAL | 0 refills | Status: DC

## 2023-08-03 NOTE — Discharge Instructions (Addendum)
 Advised patient to take medication as directed with food to completion.  Encouraged to increase daily water intake to 64 ounces per day while taking this medication.  Advised if symptoms worsen and/or unresolved please follow-up with PCP or here for further evaluation

## 2023-08-03 NOTE — ED Provider Notes (Signed)
[facility]    CSN: [id number] Arrival date & time: 08/03/23  1135      History   Chief Complaint Chief Complaint  Patient presents with   Sore Throat    HPI Dana [name] is a 63 y.o. female.   HPI Pleasant 63 year old female presents with sore throat for 3-4 days.  PMH significant for breast cancer and depression.  Past Medical History:  Diagnosis Date   Breast calcification, right 07/03/2011   Breast cancer (HCC)    Cancer (HCC)    Malignant neoplasm of right female breast (HCC) 05/17/2020    Patient Active Problem List   Diagnosis Date Noted   Elevated BP without diagnosis of hypertension 05/06/2022   Well woman exam (no gynecological exam) 05/17/2020   Lumbar degenerative disc disease 07/11/2019   IFG (impaired fasting glucose) 03/19/2014   History of right breast cancer 07/03/2011   History of depression 07/10/2008    Past Surgical History:  Procedure Laterality Date   BREAST LUMPECTOMY     BREAST RECONSTRUCTION     MASTECTOMY, RADICAL  2010   right     OB History   No obstetric history on file.      Home Medications    Prior to Admission medications   Medication Sig Start Date End Date Taking? Authorizing Provider  azithromycin (ZITHROMAX) 250 MG tablet Take 1 tablet (250 mg total) by mouth daily. Take first 2 tablets together, then 1 every day until finished. 08/03/23  Yes Trevor Iha, FNP  Calcium Carbonate-Vit D-Min (CALCIUM 1200 PO) Take 1 tablet by mouth daily.    [provider]  Flibanserin (ADDYI) 100 MG TABS Take 1 tablet (100 mg total) by mouth at bedtime. 04/15/23   Agapito Games, MD  Multiple Vitamins-Minerals (MULTIVITAMIN ADULT PO) Take 1 tablet by mouth daily.    [provider]  Omega-3 Fatty Acids (FISH OIL) 1000 MG CAPS Take 1 capsule by mouth daily.    [provider]    Family History Family History  Problem Relation Age of Onset   Esophageal cancer Mother     Hypertension Father    Coronary artery disease Father 22   Alzheimer's disease Father     Social History Social History   Tobacco Use   Smoking status: Former   Smokeless tobacco: Never  Vaping Use   Vaping status: Never Used  Substance Use Topics   Alcohol use: Yes    Alcohol/week: 6.0 standard drinks of alcohol    Types: 6 Standard drinks or equivalent per week   Drug use: No     Allergies   Patient has no known allergies.   Review of Systems Review of Systems  Constitutional:  Positive for fever.  HENT:  Positive for sore throat.   All other systems reviewed and are negative.    Physical Exam Triage Vital Signs ED Triage Vitals [08/03/23 1154]  Encounter Vitals Group     BP      Systolic BP Percentile      Diastolic BP Percentile      Pulse      Resp      Temp      Temp src      SpO2      Weight      Height      Head Circumference      Peak Flow      Pain Score 0     Pain Loc  Pain Education      Exclude from Growth Chart    No data found.  Updated Vital Signs BP 113/74   Pulse 68   Temp 98.7 F (37.1 C)   Resp 16   SpO2 98%    Physical Exam Vitals and nursing note reviewed.  Constitutional:      Appearance: She is well-developed and normal weight.  HENT:     Head: Normocephalic and atraumatic.     Right Ear: Tympanic membrane and ear canal normal.     Left Ear: Tympanic membrane and ear canal normal.     Mouth/Throat:     Mouth: Mucous membranes are moist.     Pharynx: Oropharynx is clear. Uvula midline. Posterior oropharyngeal erythema present. No pharyngeal swelling or uvula swelling.     Tonsils: 1+ on the right.  Eyes:     Conjunctiva/sclera: Conjunctivae normal.     Pupils: Pupils are equal, round, and reactive to light.  Cardiovascular:     Rate and Rhythm: Normal rate and regular rhythm.     Heart sounds: Normal heart sounds. No murmur heard. Pulmonary:     Effort: Pulmonary effort is normal.     Breath sounds: Normal  breath sounds. No wheezing, rhonchi or rales.  Musculoskeletal:     Cervical back: Normal range of motion and neck supple.  Skin:    General: Skin is warm and dry.  Neurological:     General: No focal deficit present.     Mental Status: She is alert and oriented to person, place, and time.  Psychiatric:        Mood and Affect: Mood normal.        Behavior: Behavior normal.      UC Treatments / Results  Labs (all labs ordered are listed, but only abnormal results are displayed) Labs Reviewed - No data to display  EKG   Radiology No results found.  Procedures Procedures (including critical care time)  Medications Ordered in UC Medications - No data to display  Initial Impression / Assessment and Plan / UC Course  I have reviewed the triage vital signs and the nursing notes.  Pertinent labs & imaging results that were available during my care of the patient were reviewed by me and considered in my medical decision making (see chart for details).     MDM: 1.  Acute pharyngitis, unspecified etiology-Rx'd Zithromax (500 mg day 1, then 250 mg daily x 4 days). Advised patient to take medication as directed with food to completion.  Encouraged to increase daily water intake to 64 ounces per day while taking this medication.  Advised if symptoms worsen and/or unresolved please follow-up with PCP or here for further evaluation.  Patient discharged home, hemodynamically stable. Final Clinical Impressions(s) / UC Diagnoses   Final diagnoses:  Acute pharyngitis, unspecified etiology     Discharge Instructions      Advised patient to take medication as directed with food to completion.  Encouraged to increase daily water intake to 64 ounces per day while taking this medication.  Advised if symptoms worsen and/or unresolved please follow-up with PCP or here for further evaluation.     ED Prescriptions     Medication Sig Dispense Auth. Provider   azithromycin (ZITHROMAX) 250  MG tablet Take 1 tablet (250 mg total) by mouth daily. Take first 2 tablets together, then 1 every day until finished. 6 tablet Trevor Iha, FNP      PDMP not reviewed this encounter.  Trevor Iha, FNP 08/03/23 1224

## 2023-08-03 NOTE — ED Triage Notes (Signed)
Pt presents to [facility] with co of sore throat and low grade fevers since Friday.

## 2023-08-26 ENCOUNTER — Ambulatory Visit: Payer: BC Managed Care – PPO

## 2023-08-26 DIAGNOSIS — Z1231 Encounter for screening mammogram for malignant neoplasm of breast: Secondary | ICD-10-CM

## 2023-08-30 ENCOUNTER — Encounter: Payer: Self-pay | Admitting: Family Medicine

## 2023-08-30 NOTE — Progress Notes (Signed)
 Please call patient. Normal mammogram.  Repeat in 1 year.

## 2024-05-02 ENCOUNTER — Ambulatory Visit: Admitting: Family Medicine

## 2024-05-02 ENCOUNTER — Encounter: Payer: Self-pay | Admitting: Family Medicine

## 2024-05-02 VITALS — BP 128/84 | HR 61 | Ht 68.0 in | Wt 147.0 lb

## 2024-05-02 DIAGNOSIS — Z23 Encounter for immunization: Secondary | ICD-10-CM

## 2024-05-02 DIAGNOSIS — Z0000 Encounter for general adult medical examination without abnormal findings: Secondary | ICD-10-CM

## 2024-05-02 NOTE — Progress Notes (Signed)
 Complete physical exam  Patient: Dana [name]    DOB: Apr 02, 1961 63 y.o.   MRN: [id number]  Chief Complaint  Patient presents with   Annual Exam    Subjective:    Dana [name] is a 63 y.o. female who presents today for a complete physical exam. She reports consuming a general diet. The patient does not participate in regular exercise at present. She generally feels well. She reports sleeping fairly well. She does not have additional problems to discuss today.    Most recent fall risk assessment:    05/02/2024    1:44 PM  Fall Risk   Falls in the past year? 0  Number falls in past yr: 0  Injury with Fall? 0  Risk for fall due to : No Fall Risks  Follow up Falls evaluation completed     Most recent depression screenings:    05/02/2024    4:23 PM 05/02/2024    1:44 PM  PHQ 2/9 Scores  PHQ - 2 Score 0 0  PHQ- 9 Score 0         Patient Care Team: Alvan Dorothyann BIRCH, MD as PCP - General (Family Medicine)   ROS    Objective:    BP 128/84   Pulse 61   Ht 5' 8 (1.727 m)   Wt 147 lb (66.7 kg)   SpO2 100%   BMI 22.35 kg/m     Physical Exam Constitutional:      Appearance: Normal appearance.  HENT:     Head: Normocephalic and atraumatic.     Right Ear: Tympanic membrane, ear canal and external ear normal.     Left Ear: Tympanic membrane, ear canal and external ear normal.     Nose: Nose normal.     Mouth/Throat:     Pharynx: Oropharynx is clear.  Eyes:     Extraocular Movements: Extraocular movements intact.     Conjunctiva/sclera: Conjunctivae normal.     Pupils: Pupils are equal, round, and reactive to light.  Neck:     Thyroid: No thyromegaly.  Cardiovascular:     Rate and Rhythm: Normal rate and regular rhythm.  Pulmonary:     Effort: Pulmonary effort is normal.     Breath sounds: Normal breath sounds.  Abdominal:     General: Bowel sounds are normal.     Palpations: Abdomen is soft.     Tenderness: There is no abdominal  tenderness.  Musculoskeletal:        General: No swelling.     Cervical back: Neck supple.  Skin:    General: Skin is warm and dry.  Neurological:     Mental Status: She is oriented to person, place, and time.  Psychiatric:        Mood and Affect: Mood normal.        Behavior: Behavior normal.       No results found for any visits on 05/02/24.      Assessment & Plan:    Routine Health Maintenance and Physical Exam Immunization History  Administered Date(s) Administered   Influenza Split 04/04/2012   Influenza Whole 03/13/2009, 04/17/2010   Influenza, Seasonal, Injecte, Preservative Fre 04/15/2023   Influenza,inj,Quad PF,6+ Mos 03/17/2014, 03/03/2018, 04/04/2019, 05/17/2021   Influenza,inj,Quad PF,6-35 Mos 04/15/2020   Influenza-Unspecified 04/04/2012, 03/17/2014, 03/28/2016, 02/03/2017, 03/03/2018, 04/21/2022, 03/16/2024   Moderna SARS-COV2 Booster Vaccination 03/18/2021   Moderna Sars-Covid-2 Vaccination 10/20/2019, 11/10/2019, 04/15/2020, 04/21/2022   PNEUMOCOCCAL CONJUGATE-20 05/02/2024   Pfizer(Comirnaty)Fall Seasonal Vaccine 12 years  and older 04/21/2022   Td 04/16/2001   Tdap 07/10/2011, 04/15/2023   Zoster Recombinant(Shingrix ) 03/03/2018, 05/17/2018   Zoster, Unspecified 03/03/2018, 05/17/2018    Health Maintenance  Topic Date Due   COVID-19 Vaccine (5 - 2025-26 season) 02/15/2024   Colonoscopy  08/21/2024   Mammogram  08/25/2024   Cervical Cancer Screening (HPV/Pap Cotest)  05/17/2026   DTaP/Tdap/Td (4 - Td or Tdap) 04/14/2033   Pneumococcal Vaccine: 50+ Years  Completed   Influenza Vaccine  Completed   Hepatitis C Screening  Completed   HIV Screening  Completed   Zoster Vaccines- Shingrix   Completed   Hepatitis B Vaccines 19-59 Average Risk  Aged Out   HPV VACCINES  Aged Out   Meningococcal B Vaccine  Aged Out    Discussed health benefits of physical activity, and encouraged her to engage in regular exercise appropriate for her age and  condition.  Problem List Items Addressed This Visit   None Visit Diagnoses       Annual wellness visit    -  Primary   Relevant Orders   HgB A1c   Lipid Panel With LDL/HDL Ratio   CBC   CMP14+EGFR     Encounter for immunization       Relevant Orders   Pneumococcal conjugate vaccine 20-valent (Completed)      Up-to-date on screenings given Prevnar 20 today.  Will get updated labs.  Continue to work on regular exercise and healthy diet.  Return for Wellness Exam.    Dorothyann Byars, MD [facility] at [facility]

## 2024-05-03 ENCOUNTER — Ambulatory Visit: Payer: Self-pay | Admitting: Family Medicine

## 2024-05-03 LAB — CMP14+EGFR
ALT: 17 IU/L (ref 0–32)
AST: 24 IU/L (ref 0–40)
Albumin: 4.6 g/dL (ref 3.9–4.9)
Alkaline Phosphatase: 63 IU/L (ref 49–135)
BUN/Creatinine Ratio: 16 (ref 12–28)
BUN: 10 mg/dL (ref 8–27)
Bilirubin Total: 0.7 mg/dL (ref 0.0–1.2)
CO2: 24 mmol/L (ref 20–29)
Calcium: 9.3 mg/dL (ref 8.7–10.3)
Chloride: 101 mmol/L (ref 96–106)
Creatinine, Ser: 0.63 mg/dL (ref 0.57–1.00)
Globulin, Total: 2.5 g/dL (ref 1.5–4.5)
Glucose: 99 mg/dL (ref 70–99)
Potassium: 4.2 mmol/L (ref 3.5–5.2)
Sodium: 139 mmol/L (ref 134–144)
Total Protein: 7.1 g/dL (ref 6.0–8.5)
eGFR: 99 mL/min/1.73 (ref >59)

## 2024-05-03 LAB — CBC
Hematocrit: 41.2 % (ref 34.0–46.6)
Hemoglobin: 13.1 g/dL (ref 11.1–15.9)
MCH: 31.1 pg (ref 26.6–33.0)
MCHC: 31.8 g/dL (ref 31.5–35.7)
MCV: 98 fL — ABNORMAL HIGH (ref 79–97)
Platelets: 332 x10E3/uL (ref 150–450)
RBC: 4.21 x10E6/uL (ref 3.77–5.28)
RDW: 11.5 % — ABNORMAL LOW (ref 11.7–15.4)
WBC: 6.1 x10E3/uL (ref 3.4–10.8)

## 2024-05-03 LAB — HEMOGLOBIN A1C
Est. average glucose Bld gHb Est-mCnc: 117 mg/dL
Hgb A1c MFr Bld: 5.7 % — ABNORMAL HIGH (ref 4.8–5.6)

## 2024-05-03 LAB — LIPID PANEL WITH LDL/HDL RATIO
Cholesterol, Total: 247 mg/dL — ABNORMAL HIGH (ref 100–199)
HDL: 97 mg/dL (ref >39)
LDL Chol Calc (NIH): 141 mg/dL — ABNORMAL HIGH (ref 0–99)
LDL/HDL Ratio: 1.5 ratio (ref 0.0–3.2)
Triglycerides: 55 mg/dL (ref 0–149)
VLDL Cholesterol Cal: 9 mg/dL (ref 5–40)

## 2024-05-03 NOTE — Progress Notes (Signed)
 HI Dana [name], your A1C is still in the prediabetes range but it does look better this time.  Total cholesterol and LDL cholesterol is still elevated but it does look better compared to last year.  Blood count overall looks okay.  Metabolic panel overall looks good.
